# Patient Record
Sex: Male | Born: 1995
Health system: Southern US, Community
[De-identification: ages and names within clinical notes are randomized; demographics above are authoritative.]

## PROBLEM LIST (undated history)

## (undated) DIAGNOSIS — M6289 Other specified disorders of muscle: Secondary | ICD-10-CM

## (undated) DIAGNOSIS — G43909 Migraine, unspecified, not intractable, without status migrainosus: Secondary | ICD-10-CM

## (undated) DIAGNOSIS — J45909 Unspecified asthma, uncomplicated: Secondary | ICD-10-CM

## (undated) DIAGNOSIS — T7840XA Allergy, unspecified, initial encounter: Secondary | ICD-10-CM

## (undated) DIAGNOSIS — A46 Erysipelas: Secondary | ICD-10-CM

## (undated) HISTORY — DX: Allergy, unspecified, initial encounter: T78.40XA

## (undated) HISTORY — DX: Migraine, unspecified, not intractable, without status migrainosus: G43.909

## (undated) HISTORY — DX: Erysipelas: A46

## (undated) HISTORY — DX: Unspecified asthma, uncomplicated: J45.909

---
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
## 2015-01-28 ENCOUNTER — Encounter (HOSPITAL_COMMUNITY): Payer: Self-pay | Admitting: Emergency Medicine

## 2015-01-28 ENCOUNTER — Emergency Department (INDEPENDENT_AMBULATORY_CARE_PROVIDER_SITE_OTHER): Payer: 59

## 2015-01-28 ENCOUNTER — Emergency Department (HOSPITAL_COMMUNITY)
Admission: EM | Admit: 2015-01-28 | Discharge: 2015-01-28 | Disposition: A | Discharge status: Home / Self Care | Payer: 59 | Source: Home / Self Care | Attending: Family Medicine | Admitting: Family Medicine

## 2015-01-28 DIAGNOSIS — J189 Pneumonia, unspecified organism: Secondary | ICD-10-CM

## 2015-01-28 MED ORDER — IPRATROPIUM-ALBUTEROL 0.5-2.5 (3) MG/3ML IN SOLN
3.0000 mL | Freq: Once | RESPIRATORY_TRACT | Status: AC
  Administered 2015-01-28: 3 mL via RESPIRATORY_TRACT

## 2015-01-28 MED ORDER — AZITHROMYCIN 250 MG PO TABS: 250.0000 mg | ORAL_TABLET | Freq: Every day | ORAL | Status: DC

## 2015-01-28 MED ORDER — IPRATROPIUM-ALBUTEROL 0.5-2.5 (3) MG/3ML IN SOLN
RESPIRATORY_TRACT | Status: AC
  Filled 2015-01-28: qty 3

## 2015-01-28 NOTE — Discharge Instructions (Signed)
Thank you for coming in today. °Call or go to the emergency room if you get worse, have trouble breathing, have chest pains, or palpitations.  ° °Pneumonia °Pneumonia is an infection of the lungs.  °CAUSES °Pneumonia may be caused by bacteria or a virus. Usually, these infections are caused by breathing infectious particles into the lungs (respiratory tract). °SIGNS AND SYMPTOMS  °· Cough. °· Fever. °· Chest pain. °· Increased rate of breathing. °· Wheezing. °· Mucus production. °DIAGNOSIS  °If you have the common symptoms of pneumonia, your health care provider will typically confirm the diagnosis with a chest X-ray. The X-ray will show an abnormality in the lung (pulmonary infiltrate) if you have pneumonia. Other tests of your blood, urine, or sputum may be done to find the specific cause of your pneumonia. Your health care provider may also do tests (blood gases or pulse oximetry) to see how well your lungs are working. °TREATMENT  °Some forms of pneumonia may be spread to other people when you cough or sneeze. You may be asked to wear a mask before and during your exam. Pneumonia that is caused by bacteria is treated with antibiotic medicine. Pneumonia that is caused by the influenza virus may be treated with an antiviral medicine. Most other viral infections must run their course. These infections will not respond to antibiotics.  °HOME CARE INSTRUCTIONS  °· Cough suppressants may be used if you are losing too much rest. However, coughing protects you by clearing your lungs. You should avoid using cough suppressants if you can. °· Your health care provider may have prescribed medicine if he or she thinks your pneumonia is caused by bacteria or influenza. Finish your medicine even if you start to feel better. °· Your health care provider may also prescribe an expectorant. This loosens the mucus to be coughed up. °· Take medicines only as directed by your health care provider. °· Do not smoke. Smoking is a common  cause of bronchitis and can contribute to pneumonia. If you are a smoker and continue to smoke, your cough may last several weeks after your pneumonia has cleared. °· A cold steam vaporizer or humidifier in your room or home may help loosen mucus. °· Coughing is often worse at night. Sleeping in a semi-upright position in a recliner or using a couple pillows under your head will help with this. °· Get rest as you feel it is needed. Your body will usually let you know when you need to rest. °PREVENTION °A pneumococcal shot (vaccine) is available to prevent a common bacterial cause of pneumonia. This is usually suggested for: °· People over 65 years old. °· Patients on chemotherapy. °· People with chronic lung problems, such as bronchitis or emphysema. °· People with immune system problems. °If you are over 65 or have a high risk condition, you may receive the pneumococcal vaccine if you have not received it before. In some countries, a routine influenza vaccine is also recommended. This vaccine can help prevent some cases of pneumonia. You may be offered the influenza vaccine as part of your care. °If you smoke, it is time to quit. You may receive instructions on how to stop smoking. Your health care provider can provide medicines and counseling to help you quit. °SEEK MEDICAL CARE IF: °You have a fever. °SEEK IMMEDIATE MEDICAL CARE IF:  °· Your illness becomes worse. This is especially true if you are elderly or weakened from any other disease. °· You cannot control your cough with suppressants and   are losing sleep. °· You begin coughing up blood. °· You develop pain which is getting worse or is uncontrolled with medicines. °· Any of the symptoms which initially brought you in for treatment are getting worse rather than better. °· You develop shortness of breath or chest pain. °MAKE SURE YOU:  °· Understand these instructions. °· Will watch your condition. °· Will get help right away if you are not doing well or get  worse. °Document Released: 09/08/2005 Document Revised: 01/23/2014 Document Reviewed: 11/28/2010 °ExitCare® Patient Information ©2015 [employer]. This information is not intended to replace advice given to you by your health care provider. Make sure you discuss any questions you have with your health care provider. ° °

## 2015-01-28 NOTE — ED Notes (Signed)
C/o  Nonproductive cough x 1 month.  States fever in the being couple of days.  Runny nose.  Mild sob.  Denies chest pain.  No relief with otc meds.  No n/v/d.

## 2015-01-28 NOTE — ED Provider Notes (Signed)
Carlos Clark is a 19 y.o. male who presents to Urgent Care today for cough congestion for one month associated with runny nose and mild shortness of breath. He initially had a fever and chills but none since. No chest pain or palpitations. He's tried some over-the-counter medications including Tylenol. He takes Zyrtec daily. He has a history of asthma as a child. Patient is an active [employer] [place] [employer] [place].   Past Medical History  Diagnosis Date  . Allergy   . Asthma   . Migraine headache   . Erysipelas 08/12/2012    fever/ lymphangitis/wrist lesion/+ Group A strep culture   History reviewed. No pertinent past surgical history. History  Substance Use Topics  . Smoking status: Never Smoker   . Smokeless tobacco: Never Used  . Alcohol Use: No   ROS as above Medications: No current facility-administered medications for this encounter.   Current Outpatient Prescriptions  Medication Sig Dispense Refill  . azithromycin (ZITHROMAX) 250 MG tablet Take 1 tablet (250 mg total) by mouth daily. Take first 2 tablets together, then 1 every day until finished. 6 tablet 0  . cetirizine (ZYRTEC ALLERGY) 10 MG tablet Take 10 mg by mouth as needed for allergies.     Allergies  Allergen Reactions  . Peanut-Containing Drug Products Swelling  . Shellfish Allergy Swelling    Pt is allergic all seafood     Exam:  BP 116/77 mmHg  Pulse 74  Temp(Src) 97.1 F (36.2 C) (Oral)  Resp 20  SpO2 95% Gen: Well NAD HEENT: EOMI,  MMM posterior pharynx with cobblestoning. Normal tympanic membranes bilaterally. Lungs: Normal work of breathing. Wheezing bilaterally Heart: RRR no MRG Abd: NABS, Soft. Nondistended, Nontender Exts: Brisk capillary refill, warm and well perfused.   Patient was given a 2.5/0.5 mg DuoNeb nebulizer treatment, and did not feel much better  No results found for this or any previous visit (from the past 24 hour(s)). Dg Chest 2 View  01/28/2015   CLINICAL DATA:  Cough x1  month  EXAM: CHEST - 2 VIEW  COMPARISON:  06/18/2003 by report only  FINDINGS: Patchy airspace consolidation in posterior and lateral basal segments left lower lobe. Right lung clear. Heart size normal. No effusion. Visualized skeletal structures are unremarkable.  IMPRESSION: 1. Left lower lobe pneumonia.   Electronically Signed   By: Corlis Leak  Hassell M.D.   On: 01/28/2015 13:23    Assessment and Plan: 19 y.o. male with pneumonia. Likely community-acquired. Treat with azithromycin. [facility] No running until cleared by unit physician.  Discussed warning signs or symptoms. Please see discharge instructions. Patient expresses understanding.     Rodolph BongEvan S Corey, MD 01/28/15 1336

## 2016-05-16 IMAGING — DX DG CHEST 2V
3 series · 3 of 3 positions shown · non-contrast
Comparison: 06/18/2003 by report only

CLINICAL DATA: Cough x1 month

EXAM:
CHEST - 2 VIEW

[chest pa]
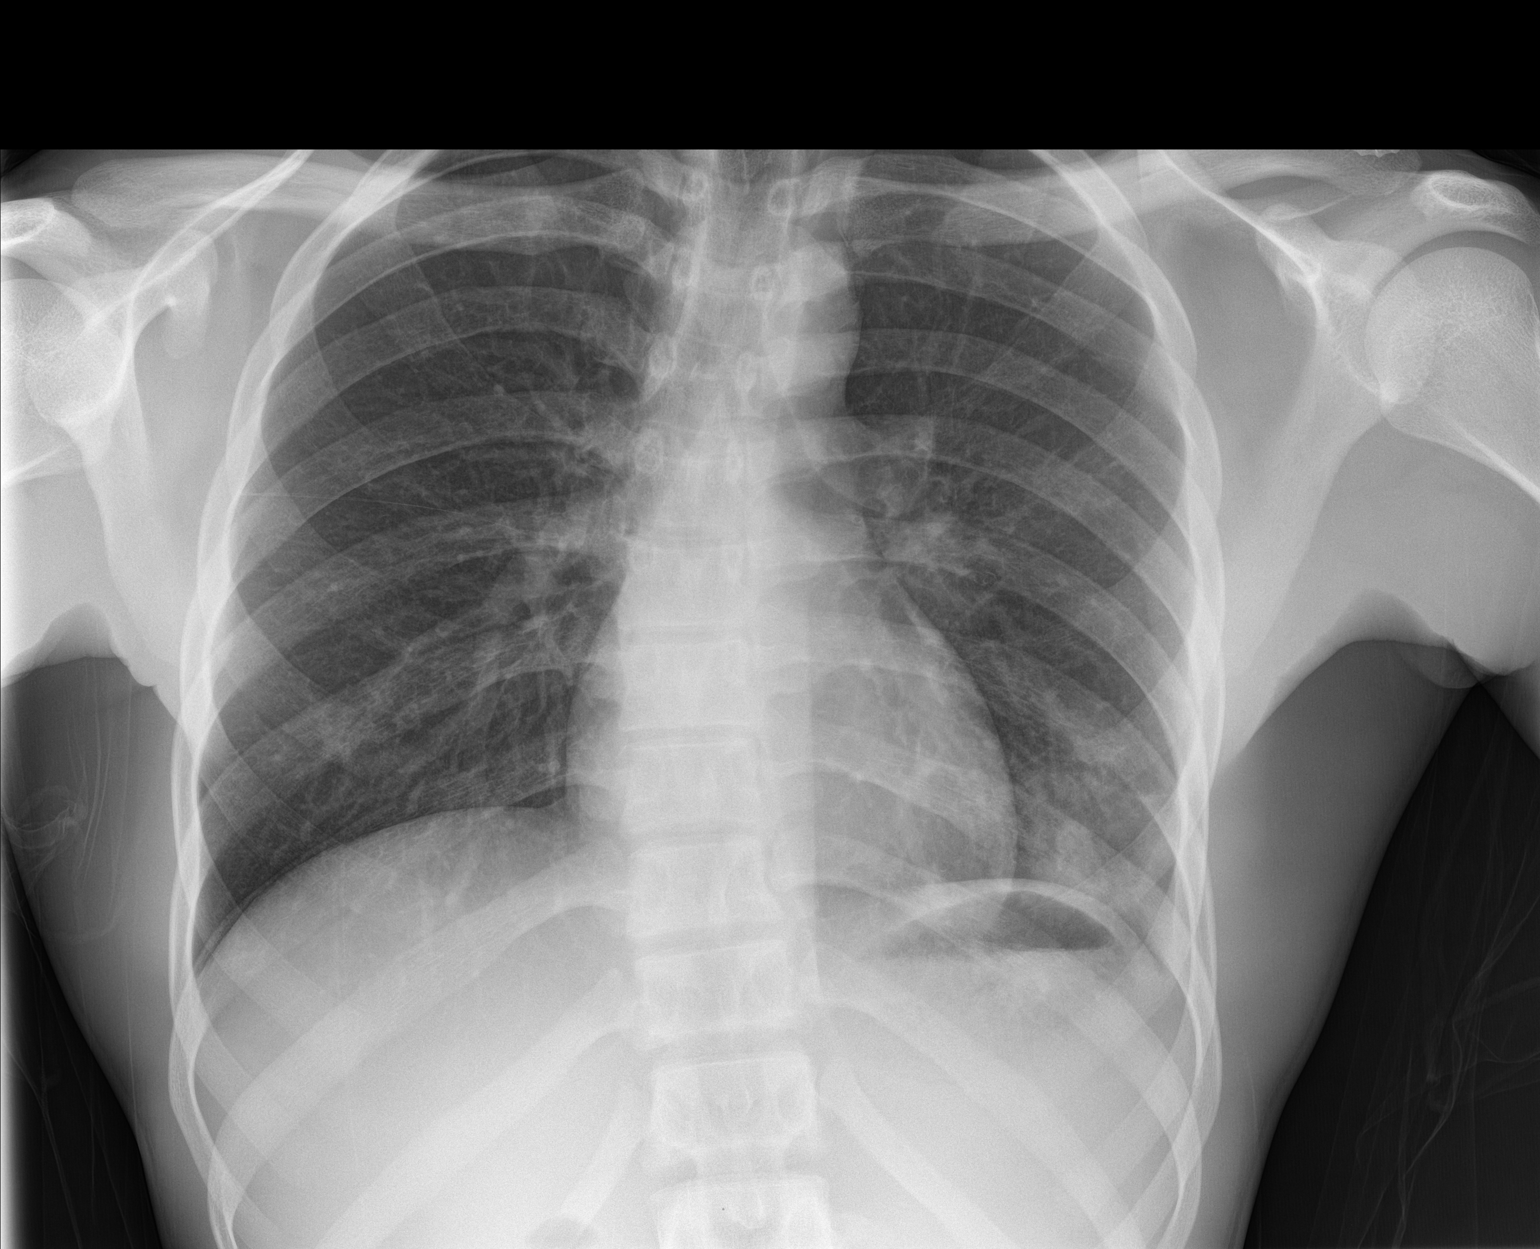

[chest lat (1 of 2)]
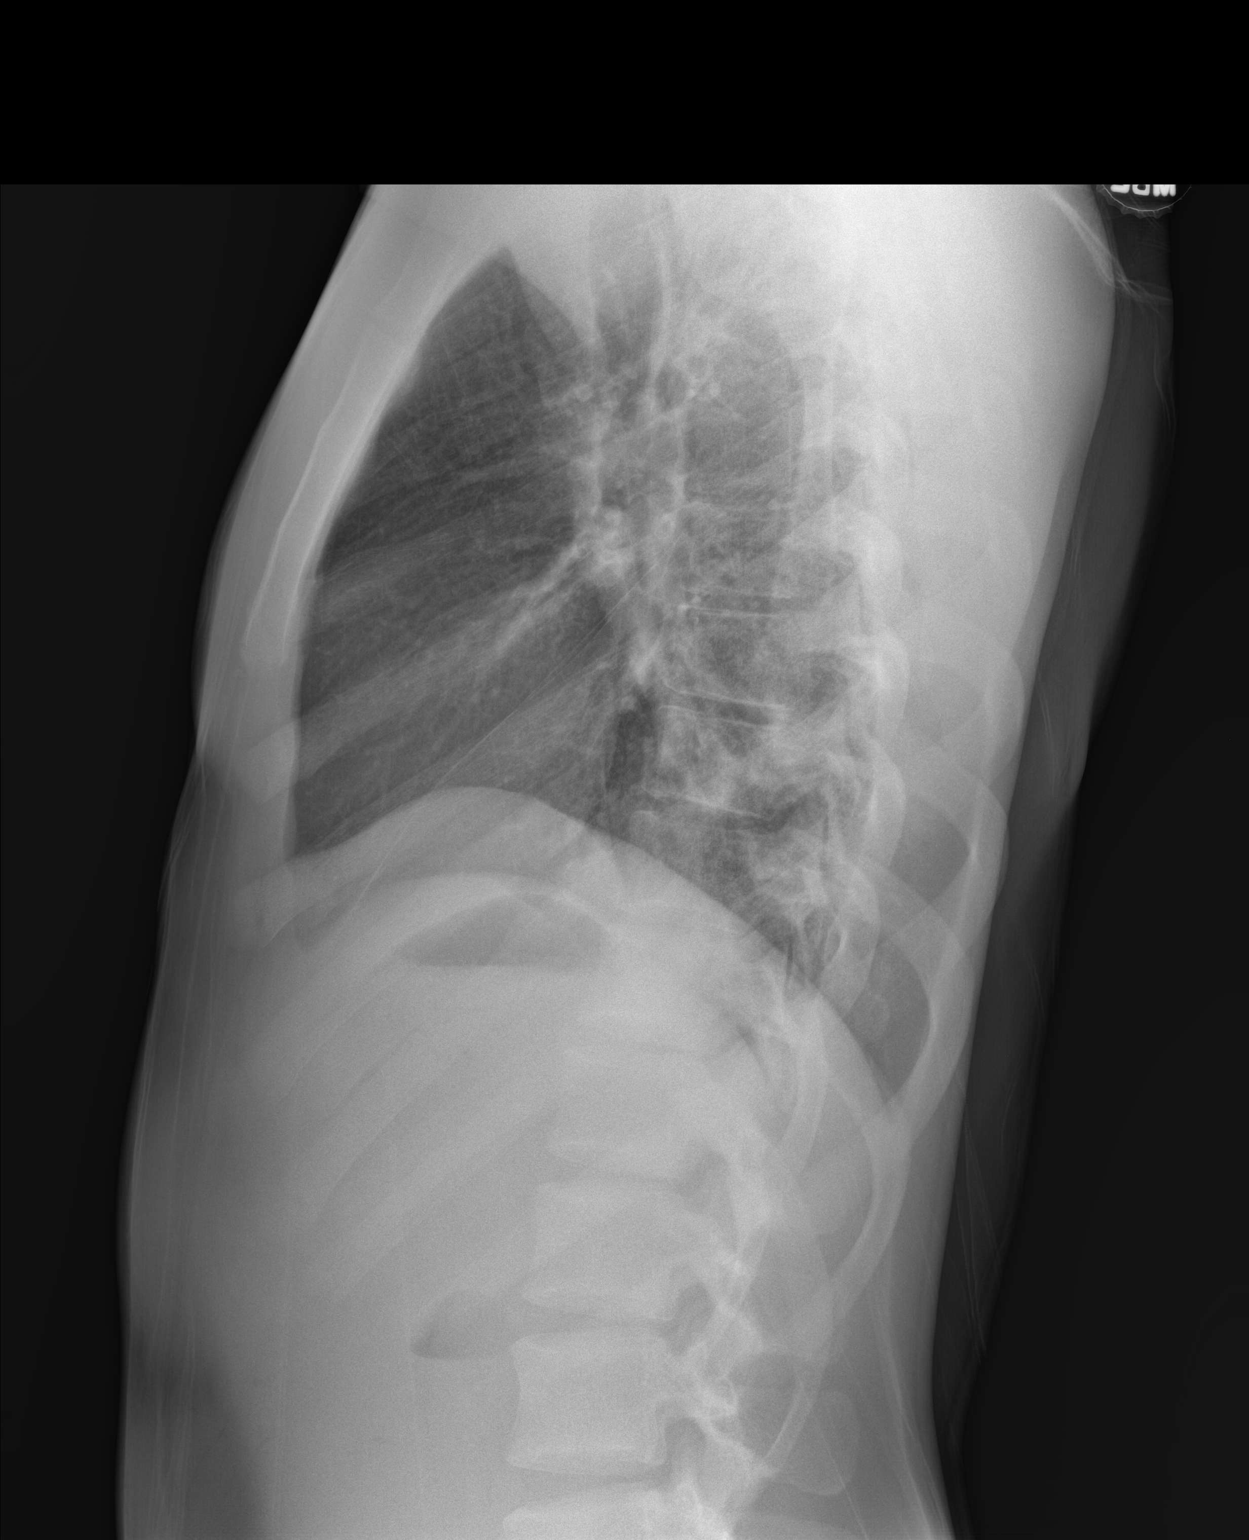

[chest lat (2 of 2)]
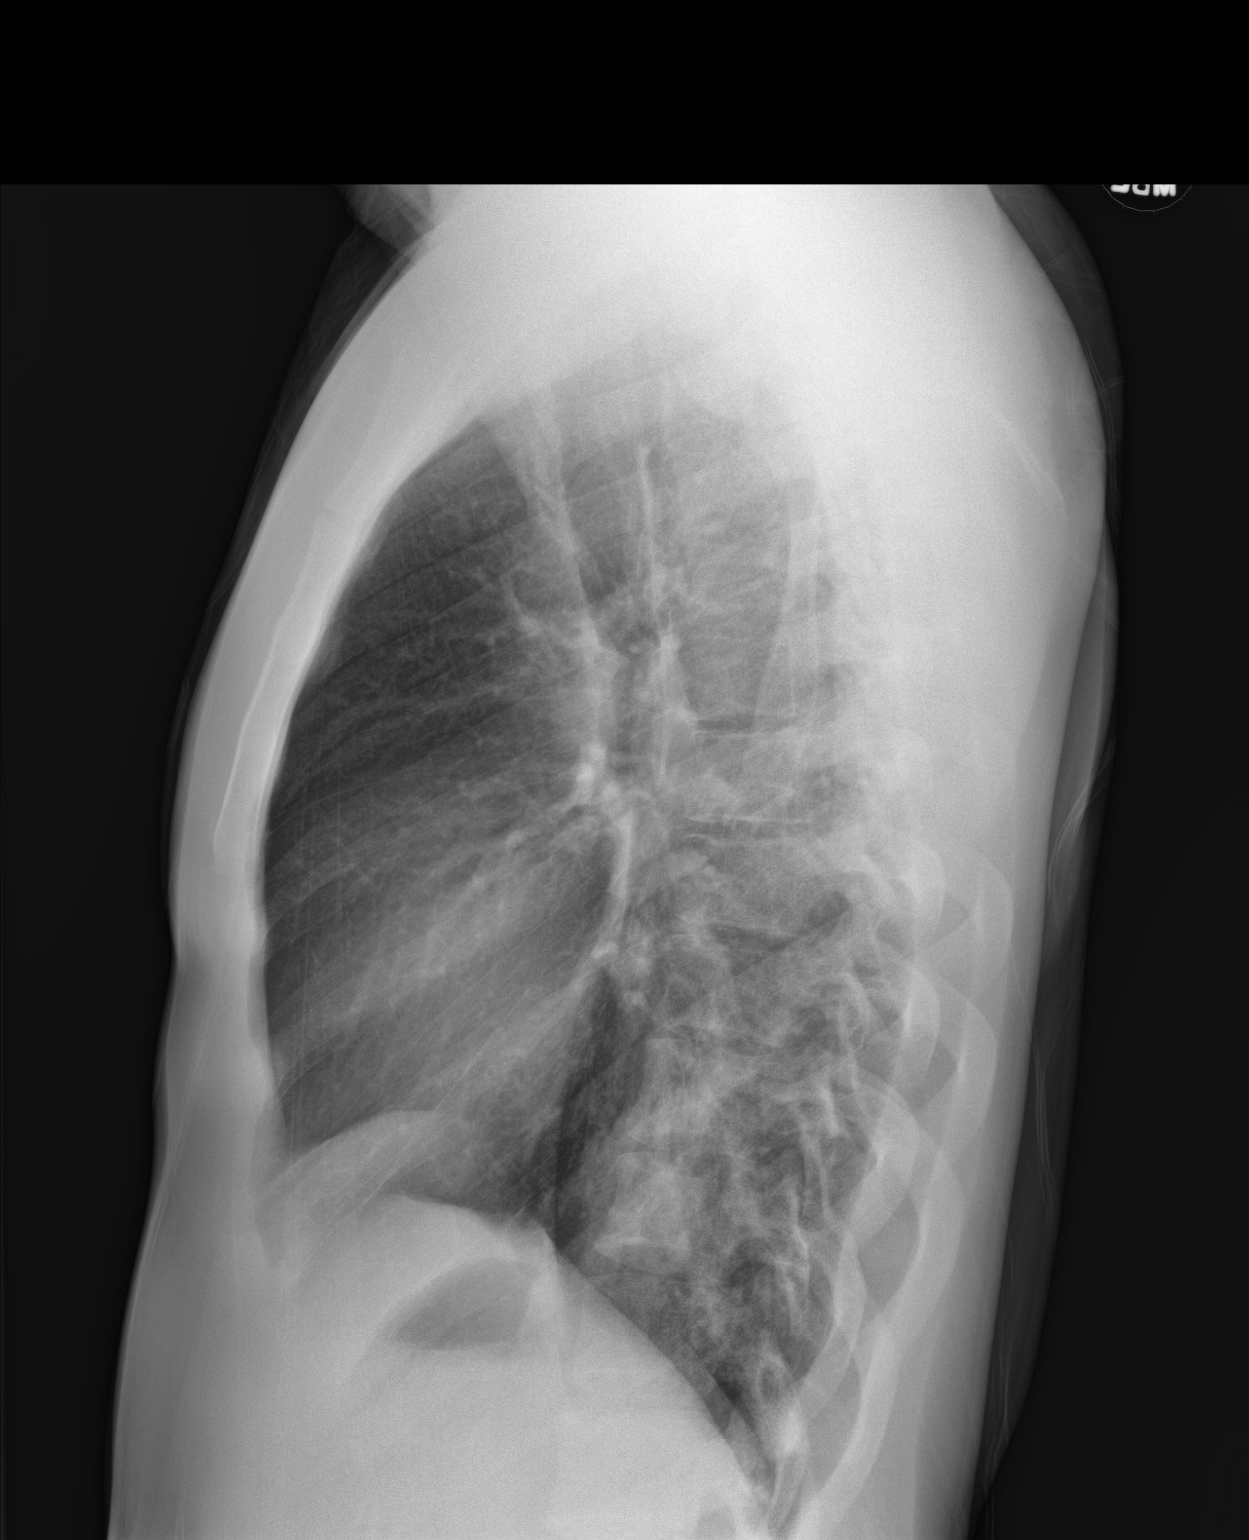

[3 of 3 positions shown; findings below may reference images not displayed]

FINDINGS: Patchy airspace consolidation in posterior and lateral basal
segments left lower lobe. Right lung clear. Heart size normal.
No effusion.
Visualized skeletal structures are unremarkable.
IMPRESSION: 1. Left lower lobe pneumonia.

## 2018-01-30 ENCOUNTER — Encounter: Payer: Self-pay | Admitting: Nurse Practitioner

## 2018-01-30 ENCOUNTER — Ambulatory Visit (INDEPENDENT_AMBULATORY_CARE_PROVIDER_SITE_OTHER): Payer: Self-pay | Admitting: Nurse Practitioner

## 2018-01-30 VITALS — BP 118/84 | HR 71 | Temp 98.4°F | Resp 18 | Wt 191.8 lb

## 2018-01-30 DIAGNOSIS — H6123 Impacted cerumen, bilateral: Secondary | ICD-10-CM

## 2018-01-30 MED ORDER — CIPROFLOXACIN-DEXAMETHASONE 0.3-0.1 % OT SUSP: 4.0000 [drp] | Freq: Two times a day (BID) | OTIC | 0 refills | Status: AC

## 2018-01-30 MED ORDER — CARBAMIDE PEROXIDE 6.5 % OT SOLN: 5.0000 [drp] | Freq: Two times a day (BID) | OTIC | 0 refills | Status: AC

## 2018-01-30 NOTE — Patient Instructions (Signed)
Earwax Buildup, Adult The ears produce a substance called earwax that helps keep bacteria out of the ear and protects the skin in the ear canal. Occasionally, earwax can build up in the ear and cause discomfort or hearing loss. What increases the risk? This condition is more likely to develop in people who:  Are male.  Are elderly.  Naturally produce more earwax.  Clean their ears often with cotton swabs.  Use earplugs often.  Use in-ear headphones often.  Wear hearing aids.  Have narrow ear canals.  Have earwax that is overly thick or sticky.  Have eczema.  Are dehydrated.  Have excess hair in the ear canal.  What are the signs or symptoms? Symptoms of this condition include:  Reduced or muffled hearing.  A feeling of fullness in the ear or feeling that the ear is plugged.  Fluid coming from the ear.  Ear pain.  Ear itch.  Ringing in the ear.  Coughing.  An obvious piece of earwax that can be seen inside the ear canal.  How is this diagnosed? This condition may be diagnosed based on:  Your symptoms.  Your medical history.  An ear exam. During the exam, your health care provider will look into your ear with an instrument called an otoscope.  You may have tests, including a hearing test. How is this treated? This condition may be treated by:  Using ear drops to soften the earwax.  Having the earwax removed by a health care provider. The health care provider may: ? Flush the ear with water. ? Use an instrument that has a loop on the end (curette). ? Use a suction device.  Surgery to remove the wax buildup. This may be done in severe cases.  Follow these instructions at home:  Take over-the-counter and prescription medicines only as told by your health care provider.  Do not put any objects, including cotton swabs, into your ear. You can clean the opening of your ear canal with a washcloth or facial tissue.  Follow instructions from your health  care provider about cleaning your ears. Do not over-clean your ears.  Drink enough fluid to keep your urine clear or pale yellow. This will help to thin the earwax.  Keep all follow-up visits as told by your health care provider. If earwax builds up in your ears often or if you use hearing aids, consider seeing your health care provider for routine, preventive ear cleanings. Ask your health care provider how often you should schedule your cleanings.  If you have hearing aids, clean them according to instructions from the manufacturer and your health care provider. Contact a health care provider if:  You have ear pain.  You develop a fever.  You have blood, pus, or other fluid coming from your ear.  You have hearing loss.  You have ringing in your ears that does not go away.  Your symptoms do not improve with treatment.  You feel like the room is spinning (vertigo). Summary  Earwax can build up in the ear and cause discomfort or hearing loss.  The most common symptoms of this condition include reduced or muffled hearing and a feeling of fullness in the ear or feeling that the ear is plugged.  This condition may be diagnosed based on your symptoms, your medical history, and an ear exam.  This condition may be treated by using ear drops to soften the earwax or by having the earwax removed by a health care provider.  Do  not put any objects, including cotton swabs, into your ear. You can clean the opening of your ear canal with a washcloth or facial tissue. This information is not intended to replace advice given to you by your health care provider. Make sure you discuss any questions you have with your health care provider. Document Released: 10/16/2004 Document Revised: 11/19/2016 Document Reviewed: 11/19/2016 Elsevier Interactive Patient Education  2018 [place] Drops, Adult You have been diagnosed with a condition that requires you to put drops of medicine into your  ears. Ear drops are a medicine that is placed in the ear. This sheet gives you information about how to use ear drops. Your health care provider may also give you more specific instructions. Supplies needed:  Cotton ball.  Medicine. How to put ear drops into your ear 1. Wash your hands thoroughly with soap and water. 2. Make sure your ears are clean and dry. If there is any ear wax or drainage at the outermost portion of the ear canal, wipe it out gently with a cotton-tipped applicator. 3. Warm up the medicine by holding it in the palm of your hand for a few minutes. 4. Shake the medicine if it is a suspension. 5. Use the dropper to draw up the medicine. 6. Hold the dropper above your ear canal and put the drops in the affected ear as instructed. Do not put the dropper into your ear at any time. It may help to pull the outer flap of the ear up and back while you put the drops in. Doing this will straighten out the ear canal so the medicine can get into the canal easier. 7. To make sure your ear soaks up the medicine, do either of these things: ? Lie down with the affected ear facing up for 10 minutes. This will cause the drops to stay in the ear canal and run down and fill the canal. ? Gently put a cotton ball in your ear canal. Leave enough of the cotton ball out so it can be easily removed. Do not push the cotton ball down into your ear with a cotton-tipped swab or other instrument. You can remove the cotton ball once the medicine has been absorbed. 8. If both ears need the drops, repeat the procedure for the other ear. Your health care provider will let you know if you need to put drops in both ears. Follow these instructions at home:  Use the ear drops for as long as directed by your health care provider, even if you begin to feel better.  Always wash your hands before and after handling the ear drops.  Keep the ear drops at room temperature.  Keep all follow-up visits as told by your  health care provider. This is important. Contact a health care provider if:  Your condition gets worse.  Your pain gets worse.  You notice any unusual drainage from your ear, especially if the drainage has a bad smell.  You have trouble hearing.  You have used the ear drops for the amount of time recommended by your health care provider, but your symptoms have not improved. Get help right away if:  You experience a form of dizziness in which you feel as if the room is spinning and you feel nauseated (vertigo).  The outside of your ear becomes red or swollen.  You develop a severe headache with or without neck stiffness. Summary  Ear drops are a medicine that is placed in the ear.  Put drops in the affected ear as instructed.  Use the ear drops for as long as directed by your health care provider, even if your symptoms begin to get better.  Keep all follow-up visits as told by your health care provider. This is important. This information is not intended to replace advice given to you by your health care provider. Make sure you discuss any questions you have with your health care provider. Document Released: 09/02/2001 Document Revised: 09/11/2016 Document Reviewed: 09/11/2016 Elsevier Interactive Patient Education  2017 [place] Irrigation What is ear irrigation? Ear irrigation is a procedure to wash dirt and wax out of your ear canal. This procedure is also called lavage. You may need ear irrigation if you are having trouble hearing because of a buildup of earwax. You may also have ear irrigation as part of the treatment for an ear infection. Getting wax and dirt out of your ear canal can help some medicines given as ear drops work better. How is ear irrigation performed? The procedure may vary among health care providers and hospitals. You may be given ear drops to put in your ear 15-20 minutes before irrigation. This helps loosen the wax. Then, a syringe containing  water and a sterile salt solution (saline) can be gently inserted into the ear canal. The saline is used to flush out wax and other debris. Ear irrigation kits are also available to use at home. Ask your health care provider if this is an option for you. Use a home irrigation kit only as told by your health care provider. Read the package instructions carefully. Follow the directions for using the syringe. Use water that is room temperature. Do not do ear irrigation at home if you:  Have diabetes. Diabetes increases the risk of infection.  Have a hole or tear in your eardrum.  Have tubes in your ears.  What are the risks of ear irrigation? Generally, this is a safe procedure. However, problems may occur, including:  Infection.  Pain.  Hearing loss.  Pushing water and debris into the eardrum. This can occur if there are holes in the eardrum.  Ear irrigation failing to work.  How should I care for my ears after having them irrigated? Cleaning  Clean the outside of your ear with a soft washcloth daily.  If told by your health care provider, use a few drops of baby oil, mineral oil, glycerin, hydrogen peroxide, or over-the-counter earwax softening drops.  Do not use cotton swabs to clean your ears. These can push wax down into the ear canal.  Do not put anything into your ears to try to remove wax. This includes ear candles. General Instructions  Take over-the-counter and prescription medicines only as told by your health care provider.  If you were prescribed an antibiotic medicine, use it as told by your health care provider. Do not stop using the antibiotic even if your condition improves.  Keep all follow-up visits as told by your health care provider. This is important.  Visit your health care provider at least once a year to have your ears and hearing checked. When should I seek medical care? Seek medical care if:  Your hearing is not improving or is getting  worse.  You have pain or redness in your ear.  You have fluid, blood, or pus coming out of your ear.  This information is not intended to replace advice given to you by your health care provider. Make sure you discuss any  questions you have with your health care provider. Document Released: 10/05/2015 Document Revised: 08/04/2016 Document Reviewed: 02/15/2015 Elsevier Interactive Patient Education  2017 [employer].

## 2018-01-30 NOTE — Progress Notes (Addendum)
Subjective:    Patient ID: Carlos Clark, male    DOB: 30-Apr-1996, 22 y.o.   MRN: [id number]  The patient is a 22 year old male who presents today for complaints of left ear pain.  The patient states the pain started about 4 days ago.  Patient denies any drainage with the left ear.  Patient denies any upper respiratory symptoms such as cough, congestion, fever, or runny nose.  Patient states he also has some pain that runs along the posterior side of the ear along his left mandible.  Patient states he has a history of his ears being clogged.  Patient also complains of diminished hearing in the left ear.  Patient normally uses peroxide for his ears which he has not done in quite some time.  Patient has not taken any medications or use peroxide for his complaints of today.  Reviewed patient's past medical history, current medications and allergies.   Review of Systems  Constitutional: Negative.   HENT: Positive for ear pain (left) and hearing loss (diminished hearing in the left ear). Negative for congestion, sinus pressure, sinus pain and sneezing.   Eyes: Negative.   Respiratory: Negative.   Cardiovascular: Negative.        Objective:   Physical Exam  Constitutional: He appears well-developed and well-nourished. No distress.  HENT:  Head: Normocephalic and atraumatic.  copius cerumen in bilateral ear.  Unable to visualize TM on both.    Eyes: Pupils are equal, round, and reactive to light. EOM are normal.  Neck: Normal range of motion. Neck supple.  Cardiovascular: Normal rate, regular rhythm and normal heart sounds.  Pulmonary/Chest: Effort normal and breath sounds normal.  Skin: Skin is warm and dry.  Psychiatric: He has a normal mood and affect.   Cerumen impaction attempted for both ears.  Good return on the left ear, able to visualize tympanic membrane after irrigation.  Tympanic membrane was opaque with visualization of the landmarks, no erythema or bulging. After irrigating  the left ear, minimal return on the disimpaction.  Had to use a curette to attempt to manually remove the cerumen.  Patient tolerated but was very uncomfortable during this procedure.  Placed approximately 0.5 mils of peroxide in the left ear canal and let it sit.  Irrigation was repeated with again minimal return.  Curette was then used again to manually disimpact the left ear.  Moderate return of cerumen which was hard and foul-smelling.  Tympanic membrane was erythematous with some bulging.        Assessment & Plan:  Bilateral cerumen impaction 1.  Ciprodex otic drops 4 drops twice daily to the left ear. 2.  Debrox otic solution 5 drops to both ears twice daily.  Instructed patient to not place drops with the Ciprodex. 3.  Lengthy discussion with patient regarding use of the Debrox and the purpose of keeping earwax soft versus being hard and how it affects hearing and equilibrium. 4.  Informed patient that he could also use peroxide to his ears when they need cleaning at home.  Patient also instructed that he could let warm water run in his ears from time to time while in the shower to help keep the earwax soft. 5.  Instructed to use ibuprofen or Tylenol for ear pain or other discomfort. 6.  Patient education provided. 7.  Patient verbalizes understanding and has no questions at time of discharge. Meds ordered this encounter  Medications  . carbamide peroxide (DEBROX) 6.5 % OTIC solution  Sig: Place 5 drops into both ears 2 (two) times daily for 10 days.    Dispense:  15 mL    Refill:  0    Order Specific Question:   Supervising Provider    Answer:   Stacie Glaze [[id number]]  . ciprofloxacin-dexamethasone (CIPRODEX) OTIC suspension    Sig: Place 4 drops into the left ear 2 (two) times daily for 10 days.    Dispense:  7.5 mL    Refill:  0    Order Specific Question:   Supervising Provider    Answer:   Stacie Glaze [phone]

## 2018-11-13 ENCOUNTER — Other Ambulatory Visit: Payer: Self-pay

## 2018-11-13 ENCOUNTER — Encounter: Payer: Self-pay | Admitting: Emergency Medicine

## 2018-11-13 ENCOUNTER — Ambulatory Visit
Admission: EM | Admit: 2018-11-13 | Discharge: 2018-11-13 | Disposition: A | Discharge status: Home / Self Care | Payer: BLUE CROSS/BLUE SHIELD | Attending: Family Medicine | Admitting: Family Medicine

## 2018-11-13 DIAGNOSIS — J4521 Mild intermittent asthma with (acute) exacerbation: Secondary | ICD-10-CM | POA: Diagnosis not present

## 2018-11-13 LAB — POCT INFLUENZA A/B
INFLUENZA A, POC: NEGATIVE
Influenza B, POC: NEGATIVE

## 2018-11-13 MED ORDER — PREDNISONE 20 MG PO TABS: 40.0000 mg | ORAL_TABLET | Freq: Every day | ORAL | 0 refills | Status: DC

## 2018-11-13 MED ORDER — ALBUTEROL SULFATE HFA 108 (90 BASE) MCG/ACT IN AERS: 2.0000 | INHALATION_SPRAY | RESPIRATORY_TRACT | 1 refills | Status: DC | PRN

## 2018-11-13 MED ORDER — BENZONATATE 100 MG PO CAPS: 100.0000 mg | ORAL_CAPSULE | Freq: Three times a day (TID) | ORAL | 0 refills | Status: DC | PRN

## 2018-11-13 MED ORDER — CETIRIZINE HCL 10 MG PO TABS: 10.0000 mg | ORAL_TABLET | ORAL | 0 refills | Status: DC | PRN

## 2018-11-13 MED ORDER — PREDNISONE 20 MG PO TABS: 40.0000 mg | ORAL_TABLET | Freq: Every day | ORAL | 0 refills | Status: AC

## 2018-11-13 NOTE — Discharge Instructions (Addendum)
Take all medications as prescribe.  Use inhaler as prescribed for shortness of breath and or wheezing every 4-6 hours.

## 2018-11-13 NOTE — ED Triage Notes (Signed)
Per pt her has been having cough, congestion, fatigue and loss of appetite for about 3 day. Pt says he is having some wheezing and discomfort when he is coughing in his chest. No fevers or chills.

## 2018-11-13 NOTE — ED Provider Notes (Signed)
[facility]    CSN: [id number] Arrival date & time: 11/13/18  1257     History   Chief Complaint Chief Complaint  Patient presents with  . Cough    HPI Carlos Dushawn Sark. is a 23 y.o. male.  HPI  History of asthma. Not currently prescribed chronic asthma management therapy, presents with concern for influenza. He has experienced a gradually worsening cough, fatigue, poor appetite, and wheezing for approximately one week. Symptoms have mildly improved, however at present,he complains of dry, hacking cough. He was prescribed years ago an albuterol inhaler but reports he rarely uses. Unknown if he has experienced fever.  Denies any other symptoms. Past Medical History:  Diagnosis Date  . Allergy   . Asthma   . Erysipelas 08/12/2012   fever/ lymphangitis/wrist lesion/+ Group A strep culture  . Migraine headache     Patient Active Problem List   Diagnosis Date Noted  . Well child check 08/11/2012  . Extrinsic asthma, history of 04/26/2012  . Seasonal allergies 04/26/2012  . Food allergy 04/26/2012    No past surgical history on file.    Home Medications    Prior to Admission medications   Medication Sig Start Date End Date Taking? Authorizing Provider  albuterol (PROVENTIL HFA;VENTOLIN HFA) 108 (90 Base) MCG/ACT inhaler Inhale 2 puffs into the lungs every 4 (four) hours as needed for wheezing or shortness of breath (cough, shortness of breath or wheezing.). 11/13/18   Bing Neighbors, FNP  benzonatate (TESSALON) 100 MG capsule Take 1-2 capsules (100-200 mg total) by mouth 3 (three) times daily as needed for cough. 11/13/18   Bing Neighbors, FNP  cetirizine (ZYRTEC ALLERGY) 10 MG tablet Take 1 tablet (10 mg total) by mouth as needed for allergies. 11/13/18   Bing Neighbors, FNP  predniSONE (DELTASONE) 20 MG tablet Take 2 tablets (40 mg total) by mouth daily with breakfast for 5 days. 11/13/18 11/18/18  Bing Neighbors, FNP    Family History Family  History  Problem Relation Age of Onset  . Hypertension Mother   . Migraines Mother   . Hypertension Maternal Uncle   . Asthma Paternal Uncle   . Diabetes Maternal Grandmother   . Hypertension Maternal Grandfather   . Diabetes Maternal Grandfather   . Hypertension Paternal Grandmother   . Asthma Paternal Grandfather     Social History Social History   Tobacco Use  . Smoking status: Never Smoker  . Smokeless tobacco: Never Used  Substance Use Topics  . Alcohol use: No  . Drug use: No     Allergies   Peanut-containing drug products and Shellfish allergy   Review of Systems Review of Systems Pertinent negatives listed in HPI  Physical Exam Triage Vital Signs ED Triage Vitals  Enc Vitals Group     BP 11/13/18 1306 (!) 145/81     Pulse Rate 11/13/18 1306 72     Resp 11/13/18 1306 16     Temp 11/13/18 1306 (!) 97.4 F (36.3 C)     Temp Source 11/13/18 1306 Oral     SpO2 11/13/18 1306 96 %     Weight 11/13/18 1307 200 lb (90.7 kg)     Height 11/13/18 1307 6\' 1"  (1.854 m)     Head Circumference --      Peak Flow --      Pain Score 11/13/18 1307 0     Pain Loc --      Pain Edu? --  Excl. in GC? --    No data found.  Updated Vital Signs BP (!) 145/81 (BP Location: Right Arm)   Pulse 72   Temp (!) 97.4 F (36.3 C) (Oral)   Resp 16   Ht 6\' 1"  (1.854 m)   Wt 200 lb (90.7 kg)   SpO2 96%   BMI 26.39 kg/m   Visual Acuity Right Eye Distance:   Left Eye Distance:   Bilateral Distance:    Right Eye Near:   Left Eye Near:    Bilateral Near:     Physical Exam Constitutional: Patient appears non-ill appearing and well-nourished. No distress. HENT: Normocephalic, atraumatic, External right and left ear normal. Oropharynx is clear and moist.  Eyes: Conjunctivae and EOM are normal. PERRLA, no scleral icterus. Neck: Normal ROM. Neck supple. No JVD. No tracheal deviation. No thyromegaly. CVS: RRR, S1/S2 +, no murmurs, no gallops, no carotid bruit.  Pulmonary:  Effort slightly increased. Wheezing present. Rhoncohrous cough present. Musculoskeletal: Normal range of motion. No edema and no tenderness. Skin: Skin is warm and dry. No rash noted. Not diaphoretic. No erythema. No pallor. Psychiatric: Normal mood and affect. Behavior, judgment, thought content normal.  UC Treatments / Results  Labs (all labs ordered are listed, but only abnormal results are displayed) Labs Reviewed  POCT INFLUENZA A/B    EKG None  Radiology No results found.  Procedures Procedures (including critical care time)  Medications Ordered in UC Medications - No data to display  Initial Impression / Assessment and Plan / UC Course  I have reviewed the triage vital signs and the nursing notes.  Pertinent labs & imaging results that were available during my care of the patient were reviewed by me and considered in my medical decision making (see chart for details).     Patient presents today with concern for influenza. Flu negative. Exam findings and symptoms most consistent with an acute asthma exacerbation. Patient is stable for discharge. Prescribed a short course of prednisone, benzonatate, cetrizine, and albuterol inhaler. Recommended follow-up with PCP or return if symptoms worsen of do no improve.  Final Clinical Impressions(s) / UC Diagnoses   Final diagnoses:  Mild intermittent asthma with acute exacerbation     Discharge Instructions     Take all medications as prescribe.  Use inhaler as prescribed for shortness of breath and or wheezing every 4-6 hours.    ED Prescriptions    Medication Sig Dispense Auth. Provider   albuterol (PROVENTIL HFA;VENTOLIN HFA) 108 (90 Base) MCG/ACT inhaler Inhale 2 puffs into the lungs every 4 (four) hours as needed for wheezing or shortness of breath (cough, shortness of breath or wheezing.). 1 Inhaler Bing Neighbors, FNP   predniSONE (DELTASONE) 20 MG tablet Take 2 tablets (40 mg total) by mouth daily with  breakfast for 5 days. 10 tablet Bing Neighbors, FNP   benzonatate (TESSALON) 100 MG capsule Take 1-2 capsules (100-200 mg total) by mouth 3 (three) times daily as needed for cough. 40 capsule Bing Neighbors, FNP   cetirizine (ZYRTEC ALLERGY) 10 MG tablet Take 1 tablet (10 mg total) by mouth as needed for allergies. 90 tablet Bing Neighbors, FNP     Controlled Substance Prescriptions [place] Controlled Substance Registry consulted? Not Applicable   Bing Neighbors, FNP 11/17/18 2132

## 2018-11-16 ENCOUNTER — Ambulatory Visit (INDEPENDENT_AMBULATORY_CARE_PROVIDER_SITE_OTHER): Payer: BLUE CROSS/BLUE SHIELD

## 2018-11-16 ENCOUNTER — Encounter (HOSPITAL_COMMUNITY): Payer: Self-pay | Admitting: Emergency Medicine

## 2018-11-16 ENCOUNTER — Ambulatory Visit (HOSPITAL_COMMUNITY)
Admission: EM | Admit: 2018-11-16 | Discharge: 2018-11-16 | Disposition: A | Discharge status: Home / Self Care | Payer: BLUE CROSS/BLUE SHIELD | Attending: Family Medicine | Admitting: Family Medicine

## 2018-11-16 DIAGNOSIS — R0789 Other chest pain: Secondary | ICD-10-CM | POA: Diagnosis not present

## 2018-11-16 DIAGNOSIS — R69 Illness, unspecified: Secondary | ICD-10-CM

## 2018-11-16 DIAGNOSIS — R05 Cough: Secondary | ICD-10-CM | POA: Diagnosis not present

## 2018-11-16 DIAGNOSIS — J111 Influenza due to unidentified influenza virus with other respiratory manifestations: Secondary | ICD-10-CM

## 2018-11-16 DIAGNOSIS — R062 Wheezing: Secondary | ICD-10-CM

## 2018-11-16 DIAGNOSIS — J40 Bronchitis, not specified as acute or chronic: Secondary | ICD-10-CM

## 2018-11-16 MED ORDER — ALBUTEROL SULFATE (2.5 MG/3ML) 0.083% IN NEBU
INHALATION_SOLUTION | RESPIRATORY_TRACT | Status: AC
  Filled 2018-11-16: qty 3

## 2018-11-16 MED ORDER — ALBUTEROL SULFATE (2.5 MG/3ML) 0.083% IN NEBU: 2.5000 mg | INHALATION_SOLUTION | Freq: Four times a day (QID) | RESPIRATORY_TRACT | 12 refills | Status: DC | PRN

## 2018-11-16 MED ORDER — DM-GUAIFENESIN ER 30-600 MG PO TB12: 1.0000 | ORAL_TABLET | Freq: Two times a day (BID) | ORAL | 0 refills | Status: DC

## 2018-11-16 NOTE — ED Provider Notes (Addendum)
[facility]    CSN: [id number] Arrival date & time: 11/16/18  1425     History   Chief Complaint Chief Complaint  Patient presents with  . Cough    HPI Carlos Clark. is a 23 y.o. male.   Patient is a 23 year old male with past medical history of asthma, allergies.  He presents with cough, congestion, wheezing.  This is been constant and remained the same since previously being seen on the 22nd.  He was treated with prednisone, Tessalon Perles and has been using albuterol inhaler.  He feels like his symptoms have not worsened but they have not improved at all.  He still having a lot of wheezing.  Denies any fevers, chest pain, shortness of breath.  ROS per HPI        Past Medical History:  Diagnosis Date  . Allergy   . Asthma   . Erysipelas 08/12/2012   fever/ lymphangitis/wrist lesion/+ Group A strep culture  . Migraine headache     Patient Active Problem List   Diagnosis Date Noted  . Well child check 08/11/2012  . Extrinsic asthma, history of 04/26/2012  . Seasonal allergies 04/26/2012  . Food allergy 04/26/2012    History reviewed. No pertinent surgical history.     Home Medications    Prior to Admission medications   Medication Sig Start Date End Date Taking? Authorizing Provider  albuterol (PROVENTIL HFA;VENTOLIN HFA) 108 (90 Base) MCG/ACT inhaler Inhale 2 puffs into the lungs every 4 (four) hours as needed for wheezing or shortness of breath (cough, shortness of breath or wheezing.). 11/13/18   Bing Neighbors, FNP  albuterol (PROVENTIL) (2.5 MG/3ML) 0.083% nebulizer solution Take 3 mLs (2.5 mg total) by nebulization every 6 (six) hours as needed for wheezing or shortness of breath. 11/16/18   Saniah Schroeter A, NP  benzonatate (TESSALON) 100 MG capsule Take 1-2 capsules (100-200 mg total) by mouth 3 (three) times daily as needed for cough. 11/13/18   Bing Neighbors, FNP  cetirizine (ZYRTEC ALLERGY) 10 MG tablet Take 1 tablet (10 mg  total) by mouth as needed for allergies. 11/13/18   Bing Neighbors, FNP  dextromethorphan-guaiFENesin [facility] DM) 30-600 MG 12hr tablet Take 1 tablet by mouth 2 (two) times daily. 11/16/18   Dahlia Byes A, NP  predniSONE (DELTASONE) 20 MG tablet Take 2 tablets (40 mg total) by mouth daily with breakfast for 5 days. 11/13/18 11/18/18  Bing Neighbors, FNP    Family History Family History  Problem Relation Age of Onset  . Hypertension Mother   . Migraines Mother   . Hypertension Maternal Uncle   . Asthma Paternal Uncle   . Diabetes Maternal Grandmother   . Hypertension Maternal Grandfather   . Diabetes Maternal Grandfather   . Hypertension Paternal Grandmother   . Asthma Paternal Grandfather     Social History Social History   Tobacco Use  . Smoking status: Never Smoker  . Smokeless tobacco: Never Used  Substance Use Topics  . Alcohol use: No  . Drug use: No     Allergies   Peanut-containing drug products and Shellfish allergy   Review of Systems Review of Systems  Constitutional: Positive for activity change. Negative for appetite change, chills, diaphoresis, fatigue, fever and unexpected weight change.  HENT: Positive for congestion.   Respiratory: Positive for cough, shortness of breath and wheezing. Negative for apnea, choking, chest tightness and stridor.   Cardiovascular: Negative for chest pain, palpitations and leg swelling.  Physical Exam Triage Vital Signs ED Triage Vitals  Enc Vitals Group     BP 11/16/18 1446 111/77     Pulse Rate 11/16/18 1446 75     Resp 11/16/18 1446 16     Temp 11/16/18 1446 98.2 F (36.8 C)     Temp Source 11/16/18 1446 Oral     SpO2 11/16/18 1446 100 %     Weight --      Height --      Head Circumference --      Peak Flow --      Pain Score 11/16/18 1448 0     Pain Loc --      Pain Edu? --      Excl. in GC? --    No data found.  Updated Vital Signs BP 111/77 (BP Location: Left Arm)   Pulse 75   Temp 98.2 F  (36.8 C) (Oral)   Resp 16   SpO2 100%   Visual Acuity Right Eye Distance:   Left Eye Distance:   Bilateral Distance:    Right Eye Near:   Left Eye Near:    Bilateral Near:     Physical Exam Vitals signs and nursing note reviewed.  Constitutional:      General: He is not in acute distress.    Appearance: Normal appearance. He is well-developed. He is not ill-appearing, toxic-appearing or diaphoretic.  HENT:     Head: Normocephalic and atraumatic.     Right Ear: Tympanic membrane and ear canal normal.     Left Ear: Tympanic membrane and ear canal normal.     Nose: Nose normal.     Mouth/Throat:     Pharynx: Oropharynx is clear.  Eyes:     Conjunctiva/sclera: Conjunctivae normal.  Neck:     Musculoskeletal: Normal range of motion and neck supple.  Cardiovascular:     Rate and Rhythm: Normal rate and regular rhythm.     Heart sounds: No murmur.  Pulmonary:     Effort: Pulmonary effort is normal. No respiratory distress.     Breath sounds: Wheezing present.  Abdominal:     Palpations: Abdomen is soft.     Tenderness: There is no abdominal tenderness.  Musculoskeletal: Normal range of motion.  Skin:    General: Skin is warm and dry.  Neurological:     Mental Status: He is alert.  Psychiatric:        Mood and Affect: Mood normal.      UC Treatments / Results  Labs (all labs ordered are listed, but only abnormal results are displayed) Labs Reviewed - No data to display  EKG None  Radiology Dg Chest 2 View  Result Date: 11/16/2018 CLINICAL DATA:  Cough and chest tightness. Wheezing. EXAM: CHEST - 2 VIEW COMPARISON:  01/28/2015. FINDINGS: Normal sized heart. Clear lungs. Mild peribronchial thickening. Stable mild to moderate dextroconvex scoliosis. IMPRESSION: Mild bronchitic changes. Electronically Signed   By: Beckie Salts M.D.   On: 11/16/2018 15:43    Procedures Procedures (including critical care time)  Medications Ordered in UC Medications - No data to  display  Initial Impression / Assessment and Plan / UC Course  I have reviewed the triage vital signs and the nursing notes.  Pertinent labs & imaging results that were available during my care of the patient were reviewed by me and considered in my medical decision making (see chart for details).     Pt is a 23 year old male with PMH  of asthma. He has been taking prednisone, albuterol inhaler, Tessalon Perles without much improvement in symptoms for last 3 days. His symptoms have not worsened. X-ray revealed bronchitis without pneumonia. We will have him continue the prednisone and Tessalon Perles Sending patient home with a nebulizer machine to use every 6 hours as needed for cough, wheezing, shortness of breath he can try Mucinex DM to see if this hospital for cough and congestion Continue the Zyrtec daily A few days given off work to rest to see if this improves symptoms Follow up as needed for continued or worsening symptoms  Final Clinical Impressions(s) / UC Diagnoses   Final diagnoses:  Bronchitis  Influenza-like illness     Discharge Instructions     X-ray revealed bronchitis No signs of infection We will send you home with a nebulizer machine to do treatments every 6 hours as needed for cough, wheezing, shortness of breath You can maybe try Mucinex DM to see if this helps better with the cough and congestion Continue the Zyrtec daily and finish out the prednisone that was prescribed I will give you a work note for the next couple of days to see if this helps Follow up as needed for continued or worsening symptoms     ED Prescriptions    Medication Sig Dispense Auth. Provider   dextromethorphan-guaiFENesin (MUCINEX DM) 30-600 MG 12hr tablet Take 1 tablet by mouth 2 (two) times daily. 14 tablet Gay Rape A, NP   albuterol (PROVENTIL) (2.5 MG/3ML) 0.083% nebulizer solution Take 3 mLs (2.5 mg total) by nebulization every 6 (six) hours as needed for wheezing or  shortness of breath. 75 mL Dahlia Byes A, NP     Controlled Substance Prescriptions [place] Controlled Substance Registry consulted? Not Applicable   Janace Aris, NP 11/17/18 1309    Dahlia Byes A, NP 11/17/18 1626

## 2018-11-16 NOTE — ED Triage Notes (Signed)
Pt c/o cough, was seen at [facility] on the 22nd, states hes not feeling better. No pain.

## 2018-11-16 NOTE — Discharge Instructions (Signed)
X-ray revealed bronchitis No signs of infection We will send you home with a nebulizer machine to do treatments every 6 hours as needed for cough, wheezing, shortness of breath You can maybe try Mucinex DM to see if this helps better with the cough and congestion Continue the Zyrtec daily and finish out the prednisone that was prescribed I will give you a work note for the next couple of days to see if this helps Follow up as needed for continued or worsening symptoms

## 2020-03-04 IMAGING — DX DG CHEST 2V
2 series · 2 of 2 positions shown · non-contrast
Comparison: 01/28/2015.

CLINICAL DATA: Cough and chest tightness. Wheezing.

EXAM:
CHEST - 2 VIEW

[chest pa]
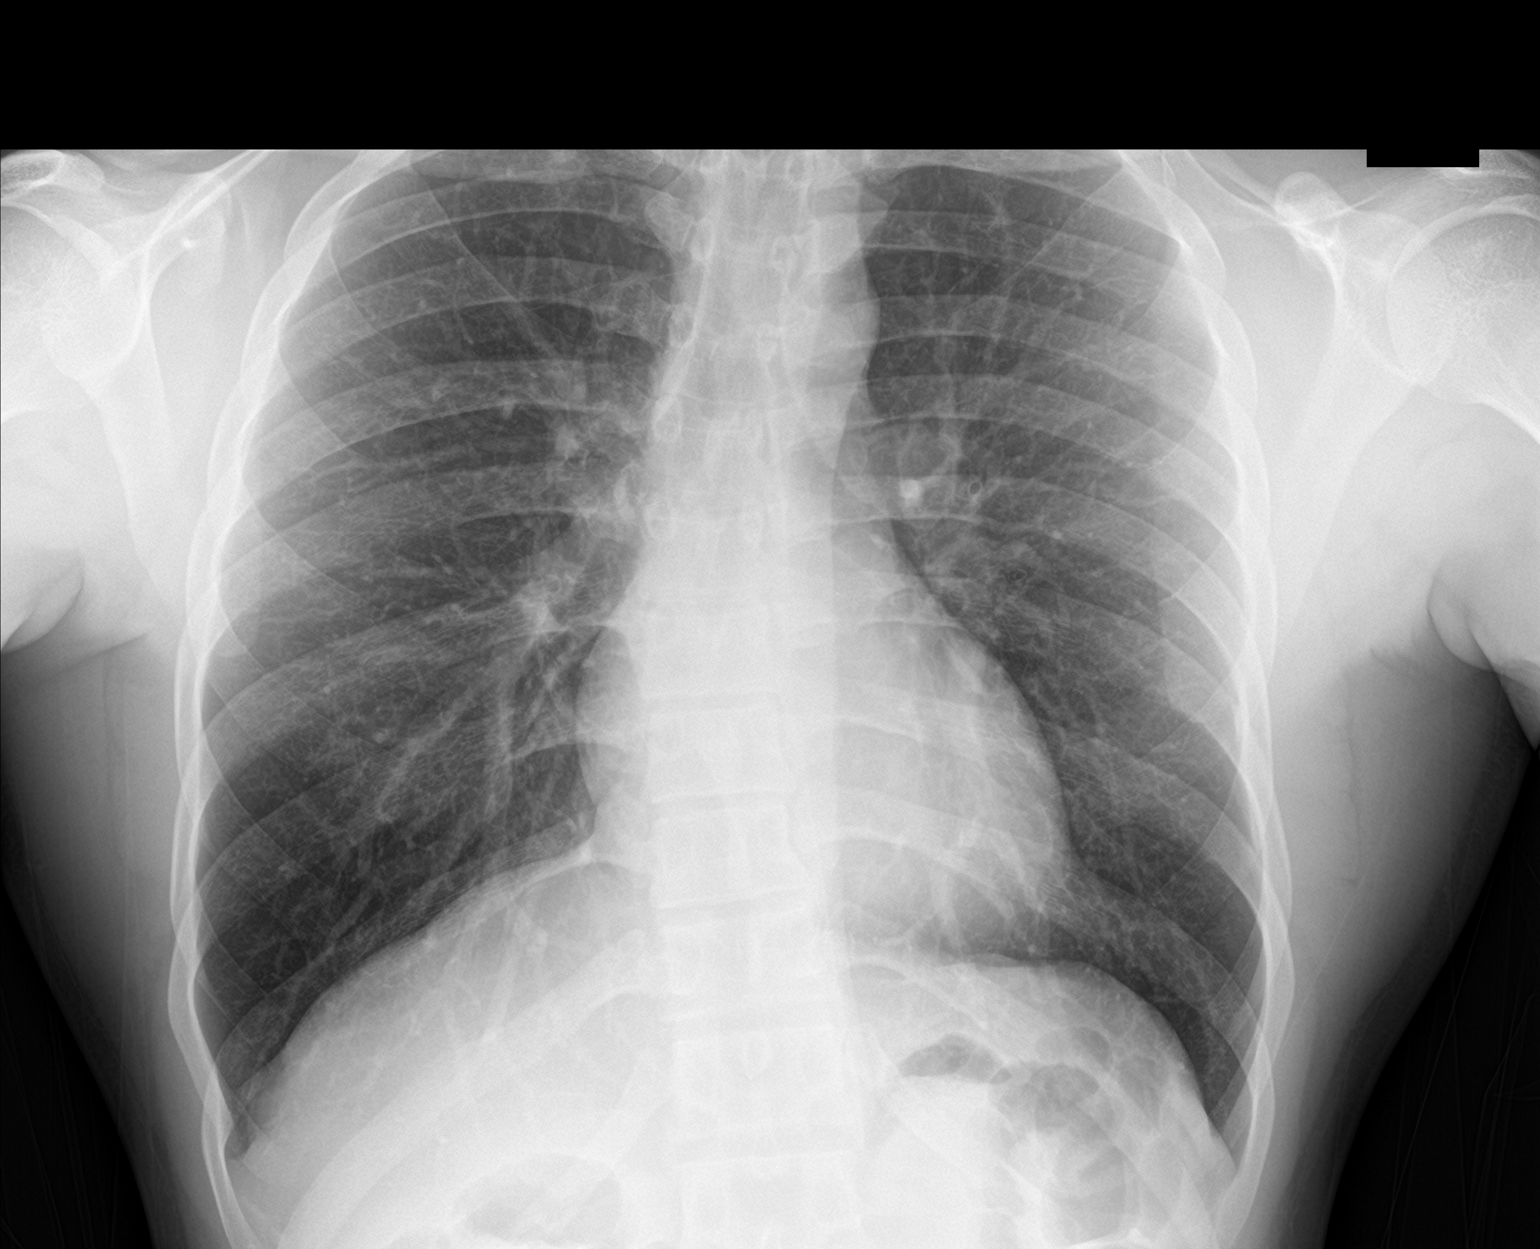

[chest lat]
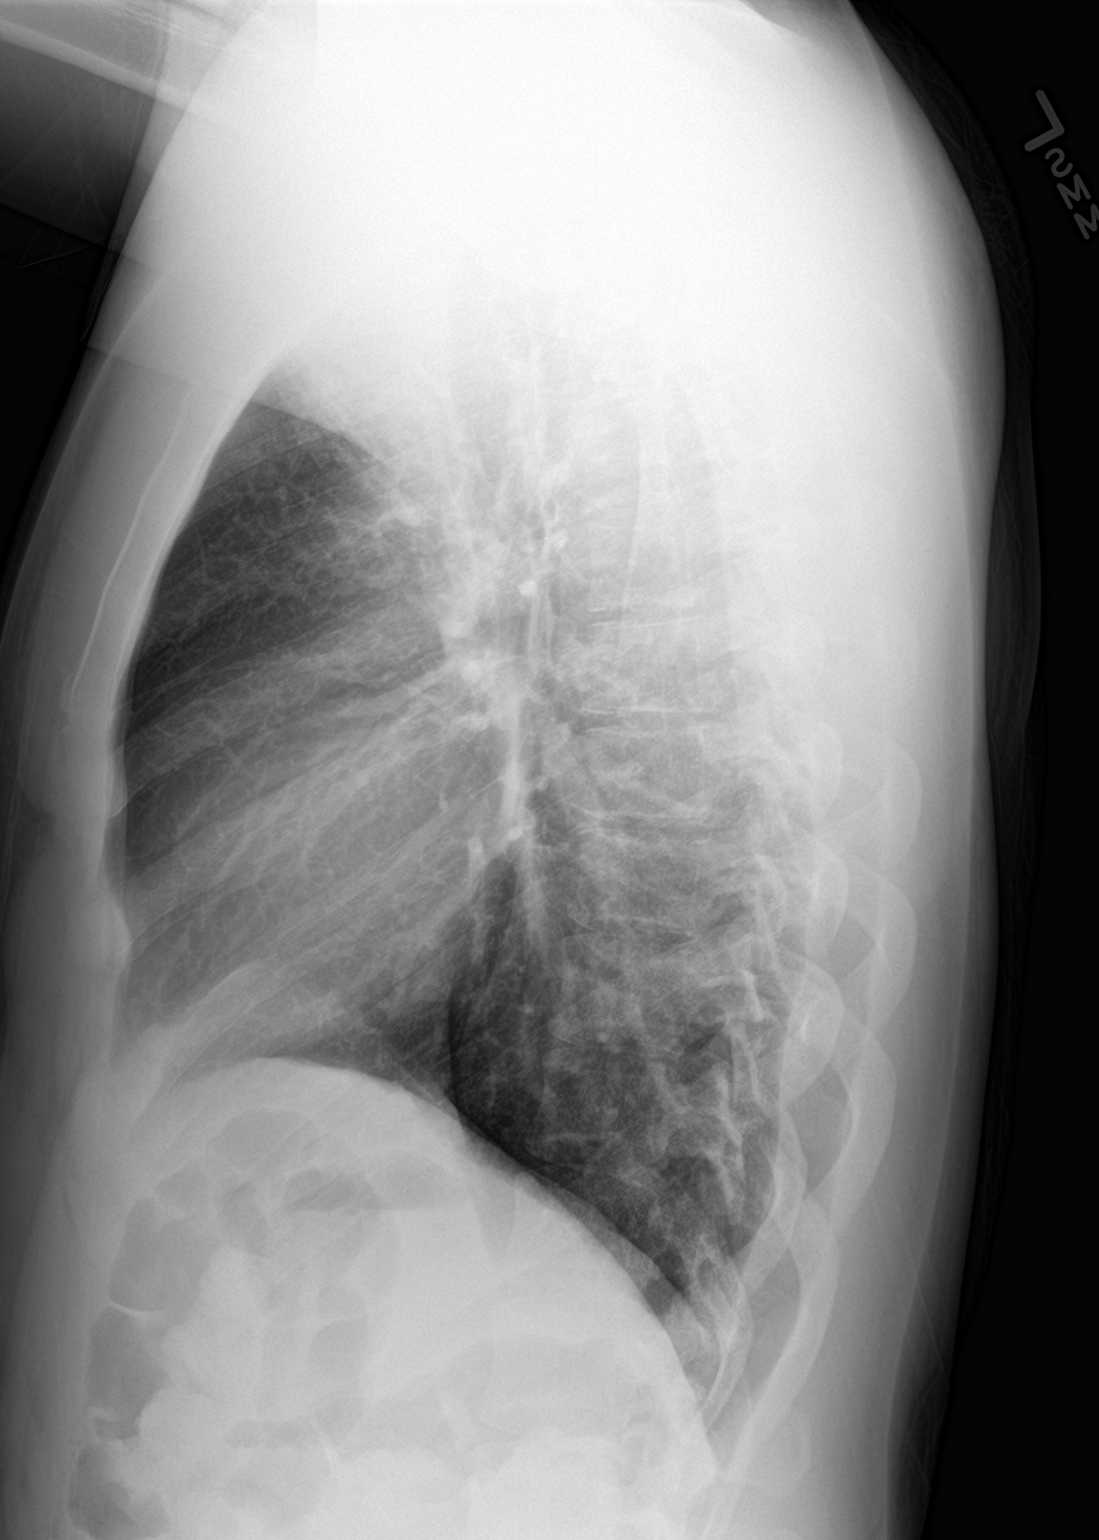

[2 of 2 positions shown; findings below may reference images not displayed]

FINDINGS: Normal sized heart. Clear lungs. Mild peribronchial thickening.
Stable mild to moderate dextroconvex scoliosis.
IMPRESSION: Mild bronchitic changes.

## 2020-09-18 ENCOUNTER — Other Ambulatory Visit: Payer: BC Managed Care – PPO

## 2020-09-18 DIAGNOSIS — Z20822 Contact with and (suspected) exposure to covid-19: Secondary | ICD-10-CM

## 2020-09-19 ENCOUNTER — Telehealth: Payer: Self-pay | Admitting: *Deleted

## 2020-09-19 LAB — SARS-COV-2, NAA 2 DAY TAT

## 2020-09-19 LAB — NOVEL CORONAVIRUS, NAA: SARS-CoV-2, NAA: DETECTED — AB

## 2020-09-19 NOTE — Telephone Encounter (Signed)
Patient notified that COVID test results were still pending at this time. Understanding verbalized. New MyChart link sent via text message.

## 2020-12-07 ENCOUNTER — Encounter: Payer: Self-pay | Admitting: Emergency Medicine

## 2020-12-07 ENCOUNTER — Other Ambulatory Visit: Payer: Self-pay

## 2020-12-07 ENCOUNTER — Ambulatory Visit
Admission: EM | Admit: 2020-12-07 | Discharge: 2020-12-07 | Disposition: A | Discharge status: Home / Self Care | Payer: BC Managed Care – PPO | Attending: Emergency Medicine | Admitting: Emergency Medicine

## 2020-12-07 DIAGNOSIS — R300 Dysuria: Secondary | ICD-10-CM

## 2020-12-07 LAB — POCT URINALYSIS DIP (MANUAL ENTRY)
Bilirubin, UA: NEGATIVE
Blood, UA: NEGATIVE
Glucose, UA: NEGATIVE mg/dL
Ketones, POC UA: NEGATIVE mg/dL
Leukocytes, UA: NEGATIVE
Nitrite, UA: NEGATIVE
Protein Ur, POC: NEGATIVE mg/dL
Spec Grav, UA: 1.02 (ref 1.010–1.025)
Urobilinogen, UA: 0.2 E.U./dL
pH, UA: 7 (ref 5.0–8.0)

## 2020-12-07 MED ORDER — DOXYCYCLINE HYCLATE 100 MG PO CAPS: 100.0000 mg | ORAL_CAPSULE | Freq: Two times a day (BID) | ORAL | 0 refills | Status: AC

## 2020-12-07 MED ORDER — CEFTRIAXONE SODIUM 500 MG IJ SOLR
500.0000 mg | Freq: Once | INTRAMUSCULAR | Status: AC
  Administered 2020-12-07: 500 mg via INTRAMUSCULAR

## 2020-12-07 NOTE — ED Provider Notes (Signed)
[facility]    CSN: [id number] Arrival date & time: 12/07/20  1504      History   Chief Complaint Chief Complaint  Patient presents with  . Dysuria    HPI Carlos Clark. is a 25 y.o. male history of asthma presenting today for evaluation of dysuria.  Patient reports that over the past 2 to 3 days has developed some intermittent dysuria and discomfort with urination.  Denies any testicle pain or swelling.  Denies penile discharge.  Reports that symptoms did began the day following protected oral intercourse, denies any penetration.  He is concerned about saliva getting on the shaft of his penis.  Denies any urinary frequency, hematuria.  Denies any known rashes.  HPI  Past Medical History:  Diagnosis Date  . Allergy   . Asthma   . Erysipelas 08/12/2012   fever/ lymphangitis/wrist lesion/+ Group A strep culture  . Migraine headache     Patient Active Problem List   Diagnosis Date Noted  . Well child check 08/11/2012  . Extrinsic asthma, history of 04/26/2012  . Seasonal allergies 04/26/2012  . Food allergy 04/26/2012    History reviewed. No pertinent surgical history.     Home Medications    Prior to Admission medications   Medication Sig Start Date End Date Taking? Authorizing Provider  doxycycline (VIBRAMYCIN) 100 MG capsule Take 1 capsule (100 mg total) by mouth 2 (two) times daily for 7 days. 12/07/20 12/14/20 Yes Laria Grimmett C, PA-C  albuterol (PROVENTIL HFA;VENTOLIN HFA) 108 (90 Base) MCG/ACT inhaler Inhale 2 puffs into the lungs every 4 (four) hours as needed for wheezing or shortness of breath (cough, shortness of breath or wheezing.). 11/13/18   Bing Neighbors, FNP  albuterol (PROVENTIL) (2.5 MG/3ML) 0.083% nebulizer solution Take 3 mLs (2.5 mg total) by nebulization every 6 (six) hours as needed for wheezing or shortness of breath. 11/16/18   Dahlia Byes A, NP  cetirizine (ZYRTEC ALLERGY) 10 MG tablet Take 1 tablet (10 mg total) by mouth  as needed for allergies. 11/13/18 12/07/20  Bing Neighbors, FNP    Family History Family History  Problem Relation Age of Onset  . Hypertension Mother   . Migraines Mother   . Hypertension Maternal Uncle   . Asthma Paternal Uncle   . Diabetes Maternal Grandmother   . Hypertension Maternal Grandfather   . Diabetes Maternal Grandfather   . Hypertension Paternal Grandmother   . Asthma Paternal Grandfather     Social History Social History   Tobacco Use  . Smoking status: Never Smoker  . Smokeless tobacco: Never Used  Substance Use Topics  . Alcohol use: No  . Drug use: No     Allergies   Peanut-containing drug products and Shellfish allergy   Review of Systems Review of Systems  Constitutional: Negative for fever.  HENT: Negative for sore throat.   Respiratory: Negative for shortness of breath.   Cardiovascular: Negative for chest pain.  Gastrointestinal: Negative for abdominal pain, nausea and vomiting.  Genitourinary: Positive for dysuria. Negative for difficulty urinating, frequency, penile discharge, penile pain, penile swelling, scrotal swelling and testicular pain.  Skin: Negative for rash.  Neurological: Negative for dizziness, light-headedness and headaches.     Physical Exam Triage Vital Signs ED Triage Vitals  Enc Vitals Group     BP 12/07/20 1545 (!) 142/83     Pulse Rate 12/07/20 1545 88     Resp 12/07/20 1545 18     Temp 12/07/20 1545  98.8 F (37.1 C)     Temp Source 12/07/20 1545 Oral     SpO2 12/07/20 1545 98 %     Weight --      Height --      Head Circumference --      Peak Flow --      Pain Score 12/07/20 1541 5     Pain Loc --      Pain Edu? --      Excl. in GC? --    No data found.  Updated Vital Signs BP (!) 142/83 (BP Location: Left Arm)   Pulse 88   Temp 98.8 F (37.1 C) (Oral)   Resp 18   SpO2 98%   Visual Acuity Right Eye Distance:   Left Eye Distance:   Bilateral Distance:    Right Eye Near:   Left Eye Near:     Bilateral Near:     Physical Exam Vitals and nursing note reviewed.  Constitutional:      Appearance: He is well-developed.     Comments: No acute distress  HENT:     Head: Normocephalic and atraumatic.     Nose: Nose normal.  Eyes:     Conjunctiva/sclera: Conjunctivae normal.  Cardiovascular:     Rate and Rhythm: Normal rate.  Pulmonary:     Effort: Pulmonary effort is normal. No respiratory distress.  Abdominal:     General: There is no distension.  Genitourinary:    Comments: Circumcised, no rashes or lesions noted, no erythema or discharge noted in urethral meatus, no testicle swelling noted Musculoskeletal:        General: Normal range of motion.     Cervical back: Neck supple.  Skin:    General: Skin is warm and dry.  Neurological:     Mental Status: He is alert and oriented to person, place, and time.      UC Treatments / Results  Labs (all labs ordered are listed, but only abnormal results are displayed) Labs Reviewed  HIV ANTIBODY (ROUTINE TESTING W REFLEX)  RPR  POCT URINALYSIS DIP (MANUAL ENTRY)  CYTOLOGY, (ORAL, ANAL, URETHRAL) ANCILLARY ONLY    EKG   Radiology No results found.  Procedures Procedures (including critical care time)  Medications Ordered in UC Medications  cefTRIAXone (ROCEPHIN) injection 500 mg (500 mg Intramuscular Given 12/07/20 1649)    Initial Impression / Assessment and Plan / UC Course  I have reviewed the triage vital signs and the nursing notes.  Pertinent labs & imaging results that were available during my care of the patient were reviewed by me and considered in my medical decision making (see chart for details).     Dysuria/STD screening-UA unremarkable for signs of UTI, most suspicious of STD as cause of dysuria.  Empirically treating for gonorrhea and chlamydia with Rocephin and doxycycline.  Urethral swab pending along with blood work for further screening.  Will call with results and alter therapy as  needed.  Discussed strict return precautions. Patient verbalized understanding and is agreeable with plan.  Final Clinical Impressions(s) / UC Diagnoses   Final diagnoses:  Dysuria     Discharge Instructions     We gave you a shot of Rocephin to treat gonorrhea, continue with doxycycline twice daily for 1 week to cover chlamydia.  We are testing you for HIV, Syphillis, Gonorrhea, Chlamydia and Trichomonas. We will call you if anything is positive and let you know if you require any further treatment. Please inform partner of any  positive results.  Please return if symptoms not improving with treatment, development of fever, nausea, vomiting, abdominal pain, scrotal pain.    ED Prescriptions    Medication Sig Dispense Auth. Provider   doxycycline (VIBRAMYCIN) 100 MG capsule Take 1 capsule (100 mg total) by mouth 2 (two) times daily for 7 days. 14 capsule Beverly Suriano, [place] C, PA-C     PDMP not reviewed this encounter.   Lew Dawes, PA-C 12/07/20 1705

## 2020-12-07 NOTE — Discharge Instructions (Addendum)
We gave you a shot of Rocephin to treat gonorrhea, continue with doxycycline twice daily for 1 week to cover chlamydia.  We are testing you for HIV, Syphillis, Gonorrhea, Chlamydia and Trichomonas. We will call you if anything is positive and let you know if you require any further treatment. Please inform partner of any positive results.  Please return if symptoms not improving with treatment, development of fever, nausea, vomiting, abdominal pain, scrotal pain.

## 2020-12-07 NOTE — ED Triage Notes (Signed)
Pt presents today with c/o of painful urination and would like to be tested for STDs and for possible UTI. X 3 days

## 2020-12-08 ENCOUNTER — Ambulatory Visit: Payer: Self-pay

## 2020-12-08 LAB — RPR: RPR Ser Ql: NONREACTIVE

## 2020-12-08 LAB — HIV ANTIBODY (ROUTINE TESTING W REFLEX): HIV Screen 4th Generation wRfx: NONREACTIVE

## 2020-12-10 LAB — CYTOLOGY, (ORAL, ANAL, URETHRAL) ANCILLARY ONLY
Chlamydia: NEGATIVE
Comment: NEGATIVE
Comment: NEGATIVE
Comment: NORMAL
Neisseria Gonorrhea: NEGATIVE
Trichomonas: NEGATIVE

## 2020-12-12 ENCOUNTER — Emergency Department
Admission: EM | Admit: 2020-12-12 | Discharge: 2020-12-12 | Disposition: A | Discharge status: Home / Self Care | Payer: BC Managed Care – PPO | Source: Home / Self Care

## 2020-12-12 ENCOUNTER — Other Ambulatory Visit: Payer: Self-pay

## 2020-12-12 DIAGNOSIS — R300 Dysuria: Secondary | ICD-10-CM | POA: Diagnosis not present

## 2020-12-12 DIAGNOSIS — R868 Other abnormal findings in specimens from male genital organs: Secondary | ICD-10-CM

## 2020-12-12 DIAGNOSIS — N4889 Other specified disorders of penis: Secondary | ICD-10-CM

## 2020-12-12 MED ORDER — METRONIDAZOLE 500 MG PO TABS: 500.0000 mg | ORAL_TABLET | Freq: Two times a day (BID) | ORAL | 0 refills | Status: DC

## 2020-12-12 NOTE — ED Triage Notes (Signed)
Pt c/o painful urination since last week. Was seen at [facility] last Friday. Tx for gonorrhea and chlamydia but tested negative. Also states he has an intermittent throbbing/burning sensation at the tip of his penis.

## 2020-12-12 NOTE — Discharge Instructions (Signed)
I have sent in flagyl for you to take twice a day for 7 days.  Your testing was negative, but with your symptoms, we will cover with the antibiotic anyway.  Follow up with this office or with primary care if symptoms are persisting.  Follow up in the ER for high fever, trouble swallowing, trouble breathing, other concerning symptoms.

## 2020-12-13 NOTE — ED Provider Notes (Signed)
[facility]    CSN: [id number] Arrival date & time: 12/12/20  1850      History   Chief Complaint Chief Complaint  Patient presents with  . STD Testing    HPI Maitland Branson Kranz. is a 25 y.o. male.   Reports dysuria for the last 2 weeks, reports discolored semen as well. Reports that there is a yellow color to his semen that he has seen over the last week. Reports small papules to shaft of the penis, just under the glans. Denies penile discharge, testicular pain/swelling, pain with ejaculation, other symptoms. Was seen at urgent care and was treated with doxycycline and Rocephin. States that he has 2 doses of doxycycline left. Reports no change in symptoms since treatment. Reports possible STD exposure previous to treatment. Cytology swab testing was negative at previous visit. Patient is concerned that he did not swab himself correctly or that it was too early. Expresses concern for trichomonas.  ROS per HPI  The history is provided by the patient.    Past Medical History:  Diagnosis Date  . Allergy   . Asthma   . Erysipelas 08/12/2012   fever/ lymphangitis/wrist lesion/+ Group A strep culture  . Migraine headache     Patient Active Problem List   Diagnosis Date Noted  . Well child check 08/11/2012  . Extrinsic asthma, history of 04/26/2012  . Seasonal allergies 04/26/2012  . Food allergy 04/26/2012    History reviewed. No pertinent surgical history.     Home Medications    Prior to Admission medications   Medication Sig Start Date End Date Taking? Authorizing Provider  metroNIDAZOLE (FLAGYL) 500 MG tablet Take 1 tablet (500 mg total) by mouth 2 (two) times daily. 12/12/20  Yes Moshe Cipro, NP  albuterol (PROVENTIL HFA;VENTOLIN HFA) 108 (90 Base) MCG/ACT inhaler Inhale 2 puffs into the lungs every 4 (four) hours as needed for wheezing or shortness of breath (cough, shortness of breath or wheezing.). 11/13/18   Bing Neighbors, FNP  albuterol  (PROVENTIL) (2.5 MG/3ML) 0.083% nebulizer solution Take 3 mLs (2.5 mg total) by nebulization every 6 (six) hours as needed for wheezing or shortness of breath. 11/16/18   Dahlia Byes A, NP  doxycycline (VIBRAMYCIN) 100 MG capsule Take 1 capsule (100 mg total) by mouth 2 (two) times daily for 7 days. 12/07/20 12/14/20  Wieters, Hallie C, PA-C  cetirizine (ZYRTEC ALLERGY) 10 MG tablet Take 1 tablet (10 mg total) by mouth as needed for allergies. 11/13/18 12/07/20  Bing Neighbors, FNP    Family History Family History  Problem Relation Age of Onset  . Hypertension Mother   . Migraines Mother   . Hypertension Maternal Uncle   . Asthma Paternal Uncle   . Diabetes Maternal Grandmother   . Hypertension Maternal Grandfather   . Diabetes Maternal Grandfather   . Hypertension Paternal Grandmother   . Asthma Paternal Grandfather     Social History Social History   Tobacco Use  . Smoking status: Never Smoker  . Smokeless tobacco: Never Used  Substance Use Topics  . Alcohol use: No  . Drug use: No     Allergies   Peanut-containing drug products and Shellfish allergy   Review of Systems Review of Systems   Physical Exam Triage Vital Signs ED Triage Vitals  Enc Vitals Group     BP 12/12/20 1909 (!) 142/84     Pulse Rate 12/12/20 1909 82     Resp 12/12/20 1909 18  Temp 12/12/20 1909 98.8 F (37.1 C)     Temp Source 12/12/20 1909 Oral     SpO2 12/12/20 1909 95 %     Weight --      Height --      Head Circumference --      Peak Flow --      Pain Score 12/12/20 1910 0     Pain Loc --      Pain Edu? --      Excl. in GC? --    No data found.  Updated Vital Signs BP (!) 142/84 (BP Location: Right Arm)   Pulse 82   Temp 98.8 F (37.1 C) (Oral)   Resp 18   SpO2 95%      Physical Exam Vitals and nursing note reviewed. Exam conducted with a chaperone present (Rachelle Idol, CMA).  Constitutional:      General: He is not in acute distress.    Appearance: Normal  appearance. He is well-developed. He is not ill-appearing.  HENT:     Head: Normocephalic and atraumatic.     Mouth/Throat:     Mouth: Mucous membranes are moist.     Pharynx: Oropharynx is clear.  Eyes:     Extraocular Movements: Extraocular movements intact.     Conjunctiva/sclera: Conjunctivae normal.     Pupils: Pupils are equal, round, and reactive to light.  Cardiovascular:     Rate and Rhythm: Normal rate and regular rhythm.     Heart sounds: No murmur heard.   Pulmonary:     Effort: Pulmonary effort is normal. No respiratory distress.     Breath sounds: Normal breath sounds.  Abdominal:     Palpations: Abdomen is soft.     Tenderness: There is no abdominal tenderness.  Genitourinary:    Penis: Circumcised.      Comments: Cluster of 8-10 small papules to the underside of the shaft of the penis at the glans, no erythema, drainage, non tender Musculoskeletal:        General: Normal range of motion.     Cervical back: Normal range of motion and neck supple.  Skin:    General: Skin is warm and dry.     Capillary Refill: Capillary refill takes less than 2 seconds.  Neurological:     General: No focal deficit present.     Mental Status: He is alert and oriented to person, place, and time.  Psychiatric:        Mood and Affect: Mood normal.        Behavior: Behavior normal.        Thought Content: Thought content normal.      UC Treatments / Results  Labs (all labs ordered are listed, but only abnormal results are displayed) Labs Reviewed - No data to display  EKG   Radiology No results found.  Procedures Procedures (including critical care time)  Medications Ordered in UC Medications - No data to display  Initial Impression / Assessment and Plan / UC Course  I have reviewed the triage vital signs and the nursing notes.  Pertinent labs & imaging results that were available during my care of the patient were reviewed by me and considered in my medical  decision making (see chart for details).    Dysuria Discolored Semen Pearly Penile Papules  Discussed that it could have been too early for a positive swab or that it may have been collected incorrectly Will go ahead and treat with Metronidazole 500mg  BID x  7 days UA negative at last visit and patient declines today Has RPR/HIV that was negative at last visit as well Follow up with this office or with primary care if symptoms are persisting.  Follow up in the ER for high fever, trouble swallowing, trouble breathing, other concerning symptoms.     Final Clinical Impressions(s) / UC Diagnoses   Final diagnoses:  Dysuria  Discolored semen  Pearly penile papules     Discharge Instructions     I have sent in flagyl for you to take twice a day for 7 days.  Your testing was negative, but with your symptoms, we will cover with the antibiotic anyway.  Follow up with this office or with primary care if symptoms are persisting.  Follow up in the ER for high fever, trouble swallowing, trouble breathing, other concerning symptoms.     ED Prescriptions    Medication Sig Dispense Auth. Provider   metroNIDAZOLE (FLAGYL) 500 MG tablet Take 1 tablet (500 mg total) by mouth 2 (two) times daily. 14 tablet Moshe Cipro, NP     PDMP not reviewed this encounter.   Moshe Cipro, NP 12/13/20 1145

## 2020-12-15 ENCOUNTER — Ambulatory Visit
Admission: RE | Admit: 2020-12-15 | Discharge: 2020-12-15 | Disposition: A | Discharge status: Home / Self Care | Payer: BC Managed Care – PPO | Source: Ambulatory Visit | Attending: Internal Medicine | Admitting: Internal Medicine

## 2020-12-15 ENCOUNTER — Other Ambulatory Visit: Payer: Self-pay

## 2020-12-15 VITALS — BP 132/82 | HR 89 | Temp 99.0°F | Resp 16

## 2020-12-15 DIAGNOSIS — N50819 Testicular pain, unspecified: Secondary | ICD-10-CM

## 2020-12-15 NOTE — ED Triage Notes (Signed)
Pt presents with testicle pain and small red spot on tip of penis. States is currently being treated for STD.

## 2020-12-15 NOTE — Discharge Instructions (Signed)
All your testing has been negative for STI You have treated antibiotics for common STIs so you do not have to worry about having an STI.

## 2020-12-15 NOTE — ED Provider Notes (Addendum)
[facility]    CSN: [id number] Arrival date & time: 12/15/20  1302      History   Chief Complaint Chief Complaint  Patient presents with  . Testicle Pain    HPI Carlos Zackariah Clark. is a 25 y.o. male comes to the urgent care complaining of testicular pain and red spots on the tip of the penis.  Patient was recently been seen twice in the urgent care for similar complaints.  He has tested negative for STI and has received ceftriaxone/doxycycline/metronidazole over the past couple of weeks.  Patient was engaged in casual sexual intercourse on 3/15.   Patient was here mainly for reassurance that his physical exam is normal  HPI  Past Medical History:  Diagnosis Date  . Allergy   . Asthma   . Erysipelas 08/12/2012   fever/ lymphangitis/wrist lesion/+ Group A strep culture  . Migraine headache     Patient Active Problem List   Diagnosis Date Noted  . Well child check 08/11/2012  . Extrinsic asthma, history of 04/26/2012  . Seasonal allergies 04/26/2012  . Food allergy 04/26/2012    History reviewed. No pertinent surgical history.     Home Medications    Prior to Admission medications   Medication Sig Start Date End Date Taking? Authorizing Provider  albuterol (PROVENTIL HFA;VENTOLIN HFA) 108 (90 Base) MCG/ACT inhaler Inhale 2 puffs into the lungs every 4 (four) hours as needed for wheezing or shortness of breath (cough, shortness of breath or wheezing.). 11/13/18   Bing Neighbors, FNP  albuterol (PROVENTIL) (2.5 MG/3ML) 0.083% nebulizer solution Take 3 mLs (2.5 mg total) by nebulization every 6 (six) hours as needed for wheezing or shortness of breath. 11/16/18   Bast, Traci A, NP  metroNIDAZOLE (FLAGYL) 500 MG tablet Take 1 tablet (500 mg total) by mouth 2 (two) times daily. 12/12/20   Moshe Cipro, NP  cetirizine (ZYRTEC ALLERGY) 10 MG tablet Take 1 tablet (10 mg total) by mouth as needed for allergies. 11/13/18 12/07/20  Bing Neighbors, FNP     Family History Family History  Problem Relation Age of Onset  . Hypertension Mother   . Migraines Mother   . Hypertension Maternal Uncle   . Asthma Paternal Uncle   . Diabetes Maternal Grandmother   . Hypertension Maternal Grandfather   . Diabetes Maternal Grandfather   . Hypertension Paternal Grandmother   . Asthma Paternal Grandfather     Social History Social History   Tobacco Use  . Smoking status: Never Smoker  . Smokeless tobacco: Never Used  Substance Use Topics  . Alcohol use: No  . Drug use: No     Allergies   Peanut-containing drug products and Shellfish allergy   Review of Systems Review of Systems  Genitourinary: Positive for testicular pain. Negative for penile pain, penile swelling and scrotal swelling.     Physical Exam Triage Vital Signs ED Triage Vitals  Enc Vitals Group     BP 12/15/20 1309 132/82     Pulse Rate 12/15/20 1309 89     Resp 12/15/20 1309 16     Temp 12/15/20 1309 99 F (37.2 C)     Temp Source 12/15/20 1309 Oral     SpO2 12/15/20 1309 98 %     Weight --      Height --      Head Circumference --      Peak Flow --      Pain Score 12/15/20 1308 4  Pain Loc --      Pain Edu? --      Excl. in GC? --    No data found.  Updated Vital Signs BP 132/82 (BP Location: Left Arm)   Pulse 89   Temp 99 F (37.2 C) (Oral)   Resp 16   SpO2 98%   Visual Acuity Right Eye Distance:   Left Eye Distance:   Bilateral Distance:    Right Eye Near:   Left Eye Near:    Bilateral Near:     Physical Exam Vitals and nursing note reviewed.  Pulmonary:     Effort: Pulmonary effort is normal.     Breath sounds: Normal breath sounds.  Abdominal:     General: Bowel sounds are normal.     Palpations: Abdomen is soft.     Comments: No groin tenderness or swelling.  Genitourinary:    Penis: Normal.      Testes: Normal.      UC Treatments / Results  Labs (all labs ordered are listed, but only abnormal results are  displayed) Labs Reviewed - No data to display  EKG   Radiology No results found.  Procedures Procedures (including critical care time)  Medications Ordered in UC Medications - No data to display  Initial Impression / Assessment and Plan / UC Course  I have reviewed the triage vital signs and the nursing notes.  Pertinent labs & imaging results that were available during my care of the patient were reviewed by me and considered in my medical decision making (see chart for details).     1.  Testicular discomfort: Patient was reassured that he is tested negative for urinary tract infection His penile exam was unremarkable No new recommendations Safe sex practices advised Final Clinical Impressions(s) / UC Diagnoses   Final diagnoses:  Testicular discomfort     Discharge Instructions     All your testing has been negative for STI You have treated antibiotics for common STIs so you do not have to worry about having an STI.   ED Prescriptions    None     PDMP not reviewed this encounter.   Merrilee Jansky, MD 12/15/20 1407    Merrilee Jansky, MD 12/15/20 [phone]

## 2020-12-29 ENCOUNTER — Other Ambulatory Visit: Payer: Self-pay

## 2020-12-29 ENCOUNTER — Other Ambulatory Visit (HOSPITAL_COMMUNITY)
Admission: RE | Admit: 2020-12-29 | Discharge: 2020-12-29 | Disposition: A | Discharge status: Home / Self Care | Payer: BC Managed Care – PPO | Source: Ambulatory Visit | Attending: Family Medicine | Admitting: Family Medicine

## 2020-12-29 ENCOUNTER — Emergency Department
Admission: RE | Admit: 2020-12-29 | Discharge: 2020-12-29 | Disposition: A | Discharge status: Home / Self Care | Payer: BC Managed Care – PPO | Source: Ambulatory Visit | Attending: Family Medicine | Admitting: Family Medicine

## 2020-12-29 VITALS — BP 132/89 | HR 92 | Temp 99.1°F | Resp 20 | Ht 73.0 in | Wt 209.0 lb

## 2020-12-29 DIAGNOSIS — R300 Dysuria: Secondary | ICD-10-CM | POA: Diagnosis not present

## 2020-12-29 NOTE — ED Provider Notes (Addendum)
[name]    CSN: [id number] Arrival date & time: 12/29/20  0944      History   Chief Complaint Chief Complaint  Patient presents with  . Dysuria  . Testicle Pain    HPI Carlos Clark. is a 25 y.o. male.   HPI   Patient is here for dysuria.  States he is also having intermittent aching in his left testicle.  He is not having any testicular pain today.  He states that there is no mass or swelling in his testicle.  He states that dysuria has persisted ever since early March.  He had an unprotected sexual encounter at that time.  He has had testing and treatment for his dysuria, this is his fourth medical visit.  He has had 2 courses of doxycycline.  Treatment with azithromycin.  A shot of Rocephin.  A course of metronidazole.  In addition to the visits documented in the medical record he has had a televisit.  It has been recommended to him to go to urology in follow-up.  He is requesting referral to urology. He has no rash.  He has no lesions.  He has no fever or chills.  He has no other illness. He has a lot of questions about what this could be.  He has been looking on the Internet and speaking to other medical providers.  He worries whether he could have a resistant bacteria or an unusual bacteria.  He worries that it could be early symptoms from a virus such as HIV, HPV, HSV.  I explained to him that the HIV testing does need to be repeated in another month.  There is no testing for HPV or HSV in a male without lesions.   Past Medical History:  Diagnosis Date  . Allergy   . Asthma   . Erysipelas 08/12/2012   fever/ lymphangitis/wrist lesion/+ Group A strep culture  . Migraine headache     Patient Active Problem List   Diagnosis Date Noted  . Well child check 08/11/2012  . Extrinsic asthma, history of 04/26/2012  . Seasonal allergies 04/26/2012  . Food allergy 04/26/2012    History reviewed. No pertinent surgical history.     Home Medications     Prior to Admission medications   Medication Sig Start Date End Date Taking? Authorizing Provider  albuterol (PROVENTIL HFA;VENTOLIN HFA) 108 (90 Base) MCG/ACT inhaler Inhale 2 puffs into the lungs every 4 (four) hours as needed for wheezing or shortness of breath (cough, shortness of breath or wheezing.). 11/13/18   Bing Neighbors, FNP  albuterol (PROVENTIL) (2.5 MG/3ML) 0.083% nebulizer solution Take 3 mLs (2.5 mg total) by nebulization every 6 (six) hours as needed for wheezing or shortness of breath. 11/16/18   Dahlia Byes A, NP  cetirizine (ZYRTEC ALLERGY) 10 MG tablet Take 1 tablet (10 mg total) by mouth as needed for allergies. 11/13/18 12/07/20  Bing Neighbors, FNP    Family History Family History  Problem Relation Age of Onset  . Hypertension Mother   . Migraines Mother   . Hypertension Maternal Uncle   . Asthma Paternal Uncle   . Diabetes Maternal Grandmother   . Hypertension Maternal Grandfather   . Diabetes Maternal Grandfather   . Hypertension Paternal Grandmother   . Asthma Paternal Grandfather   . Healthy Father     Social History Social History   Tobacco Use  . Smoking status: Never Smoker  . Smokeless tobacco: Never Used  Vaping Use  .  Vaping Use: Never used  Substance Use Topics  . Alcohol use: No  . Drug use: No     Allergies   Peanut-containing drug products and Shellfish allergy   Review of Systems Review of Systems See HPI  Physical Exam Triage Vital Signs ED Triage Vitals  Enc Vitals Group     BP 12/29/20 1006 132/89     Pulse Rate 12/29/20 1006 92     Resp 12/29/20 1006 20     Temp 12/29/20 1006 99.1 F (37.3 C)     Temp Source 12/29/20 1006 Oral     SpO2 12/29/20 1006 99 %     Weight 12/29/20 1008 209 lb (94.8 kg)     Height 12/29/20 1008 6\' 1"  (1.854 m)     Head Circumference --      Peak Flow --      Pain Score 12/29/20 1007 5     Pain Loc --      Pain Edu? --      Excl. in GC? --    No data found.  Updated Vital  Signs BP 132/89 (BP Location: Right Arm)   Pulse 92   Temp 99.1 F (37.3 C) (Oral)   Resp 20   Ht 6\' 1"  (1.854 m)   Wt 94.8 kg   SpO2 99%   BMI 27.57 kg/m      Physical Exam Constitutional:      General: He is not in acute distress.    Appearance: He is well-developed.  HENT:     Head: Normocephalic and atraumatic.     Mouth/Throat:     Comments: Mask is in place Eyes:     Conjunctiva/sclera: Conjunctivae normal.     Pupils: Pupils are equal, round, and reactive to light.  Cardiovascular:     Rate and Rhythm: Normal rate.  Pulmonary:     Effort: Pulmonary effort is normal. No respiratory distress.  Abdominal:     General: There is no distension.     Palpations: Abdomen is soft.  Genitourinary:    Comments: Penis is normal.  Circumcised.  Pearly papules noted are still present.  No discharge.  No rash or lesion.  No adenopathy.  No mass or tenderness of testicles. Musculoskeletal:        General: Normal range of motion.     Cervical back: Normal range of motion.  Skin:    General: Skin is warm and dry.  Neurological:     Mental Status: He is alert.      UC Treatments / Results  Labs (all labs ordered are listed, but only abnormal results are displayed) Labs Reviewed  URINE CULTURE  CYTOLOGY, (ORAL, ANAL, URETHRAL) ANCILLARY ONLY    EKG   Radiology No results found.  Procedures Procedures (including critical care time)  Medications Ordered in UC Medications - No data to display  Initial Impression / Assessment and Plan / UC Course  I have reviewed the triage vital signs and the nursing notes.  Pertinent labs & imaging results that were available during my care of the patient were reviewed by me and considered in my medical decision making (see chart for details).     *Urology referral is placed.  Will treat any infections identified on testing Patient had seen multiple providers and spoken with a televisit as well as doing a lot of Internet  research.  He had multiple questions.  More than 30 minutes was spent in answering patient's questions Final Clinical Impressions(s) /  UC Diagnoses   Final diagnoses:  Dysuria     Discharge Instructions     Drink lots of water The urine/swab results will be available in a couple of days You will be called if any tests are positive  Check My Chart for results   ED Prescriptions    None     PDMP not reviewed this encounter.   Eustace Moore, MD 12/29/20 1157    Eustace Moore, MD 12/29/20 1159

## 2020-12-29 NOTE — Discharge Instructions (Addendum)
Drink lots of water The urine/swab results will be available in a couple of days You will be called if any tests are positive  Check My Chart for results

## 2020-12-29 NOTE — ED Triage Notes (Signed)
Pt presents to Urgent Care with c/o continued s/s of STI after being treated w/ various medications recently. States he has dysuria and generalized discomfort in urethra and also pain in L testicle.

## 2020-12-30 LAB — URINE CULTURE
MICRO NUMBER:: 11752739
Result:: NO GROWTH
SPECIMEN QUALITY:: ADEQUATE

## 2021-01-01 LAB — CYTOLOGY, (ORAL, ANAL, URETHRAL) ANCILLARY ONLY
Chlamydia: NEGATIVE
Comment: NEGATIVE
Comment: NEGATIVE
Comment: NORMAL
Neisseria Gonorrhea: NEGATIVE
Trichomonas: NEGATIVE

## 2021-01-17 ENCOUNTER — Encounter: Payer: Self-pay | Admitting: Urology

## 2021-01-17 ENCOUNTER — Ambulatory Visit: Payer: BC Managed Care – PPO | Admitting: Urology

## 2021-01-17 ENCOUNTER — Other Ambulatory Visit: Payer: Self-pay

## 2021-01-17 VITALS — BP 143/92 | HR 73 | Ht 74.0 in | Wt 208.0 lb

## 2021-01-17 DIAGNOSIS — R102 Pelvic and perineal pain: Secondary | ICD-10-CM | POA: Diagnosis not present

## 2021-01-17 DIAGNOSIS — R300 Dysuria: Secondary | ICD-10-CM

## 2021-01-17 MED ORDER — CELECOXIB 200 MG PO CAPS
200.0000 mg | ORAL_CAPSULE | Freq: Every day | ORAL | 0 refills | Status: DC
Start: 2021-01-17 — End: 2021-11-25

## 2021-01-17 NOTE — Patient Instructions (Signed)
Pelvic Floor Dysfunction  Pelvic floor dysfunction (PFD) is a condition that results when the group of muscles and connective tissues that support the organs in the pelvis (pelvic floor muscles) do not work well. These muscles and their connections form a sling that supports the colon and bladder. In men, these muscles also support the prostate gland. In women, they also support the uterus. PFD causes pelvic floor muscles to be too weak, too tight, or a combination of both. In PFD, muscle movements are not coordinated. This condition may cause bowel or bladder problems. It may also cause pain. What are the causes? This condition may be caused by an injury to the pelvic area or by a weakening of pelvic muscles. This often results from pregnancy and childbirth or other types of strain. In many cases, the exact cause is not known. What increases the risk? The following factors may make you more likely to develop this condition:  Having a condition of chronic bladder tissue inflammation (interstitial cystitis).  Being an older person.  Being overweight.  Radiation treatment for cancer in the pelvic region.  Previous pelvic surgery, such as removal of the uterus (hysterectomy) or prostate gland (prostatectomy). What are the signs or symptoms? Symptoms of this condition vary and may include:  Bladder symptoms, such as: ? Trouble starting urination and emptying the bladder. ? Frequent urinary tract infections. ? Leaking urine when coughing, laughing, or exercising (stress incontinence). ? Having to pass urine urgently or frequently. ? Pain when passing urine.  Bowel symptoms, such as: ? Constipation. ? Urgent or frequent bowel movements. ? Incomplete bowel movements. ? Painful bowel movements. ? Leaking stool or gas.  Unexplained genital or rectal pain.  Genital or rectal muscle spasms.  Low back pain. In women, symptoms of PFD may also include:  A heavy, full, or aching feeling in  the vagina.  A bulge that protrudes into the vagina.  Pain during or after sexual intercourse. How is this diagnosed? This condition may be diagnosed based on:  Your symptoms and medical history.  A physical exam. During the exam, your health care provider may check your pelvic muscles for tightness, spasm, pain, or weakness. This may include a rectal exam and a pelvic exam for women. In some cases, you may have diagnostic tests, such as:  Electrical muscle function tests.  Urine flow testing.  X-ray tests of bowel function.  Ultrasound of the pelvic organs. How is this treated? Treatment for this condition depends on your symptoms. Treatment options include:  Physical therapy. This may include Kegel exercises to help relax or strengthen the pelvic floor muscles.  Biofeedback. This type of therapy provides feedback on how tight your pelvic floor muscles are so that you can learn to control them.  Internal or external massage therapy.  A treatment that involves electrical stimulation of the pelvic floor muscles to help control pain (transcutaneous electrical nerve stimulation, or TENS).  Sound wave therapy (ultrasound) to reduce muscle spasms.  Medicines, such as: ? Muscle relaxants. ? Bladder control medicines. Surgery to reconstruct or support pelvic floor muscles may be an option if other treatments do not help. Follow these instructions at home: Activity  Do your usual activities as told by your health care provider. Ask your health care provider if you should modify any activities.  Do pelvic floor strengthening or relaxing exercises at home as told by your physical therapist. Lifestyle  Maintain a healthy weight.  Eat foods that are high in fiber, such as   beans, whole grains, and fresh fruits and vegetables.  Limit foods that are high in fat and processed sugars, such as fried or sweet foods.  Manage stress with relaxation techniques such as yoga or  meditation. General instructions  If you have problems with leakage: ? Use absorbable pads or wear padded underwear. ? Wash frequently with mild soap. ? Keep your genital and anal area as clean and dry as possible. ? Ask your health care provider if you should try a barrier cream to prevent skin irritation.  Take warm baths to relieve pelvic muscle tension or spasms.  Take over-the-counter and prescription medicines only as told by your health care provider.  Keep all follow-up visits as told by your health care provider. This is important. Contact a health care provider if you:  Are not improving with home care.  Have signs or symptoms of PFD that get worse at home.  Develop new signs or symptoms at home.  Have signs of a urinary tract infection, such as: ? Fever. ? Chills. ? Urinary frequency. ? A burning feeling when urinating.  Have not had a bowel movement in 3 days (constipation). Summary  Pelvic floor dysfunction results when the muscles and connective tissues in your pelvic floor do not work well.  These muscles and their connections form a sling that supports your colon and bladder. In men, these muscles also support the prostate gland. In women, they also support the uterus.  PFD may be caused by an injury to the pelvic area or by a weakening of pelvic muscles.  PFD causes pelvic floor muscles to be too weak, too tight, or a combination of both. Symptoms may vary from person to person.  In most cases, PFD can be treated with physical therapies and medicines. Surgery may be an option if other treatments do not help. This information is not intended to replace advice given to you by your health care provider. Make sure you discuss any questions you have with your health care provider. Document Revised: 03/29/2018 Document Reviewed: 03/29/2018 Elsevier Patient Education  2021 [employer] Inc[employer]  

## 2021-01-17 NOTE — Progress Notes (Signed)
   01/17/21 9:25 AM   Carlos Clark. 03/15/1996 [id number]  CC: Dysuria, left testicular pain  HPI: I saw [name] for the above issues today.  He works as a Naval [employer].  He has had multiple ER visits over the last 6 weeks after developing dysuria and left testicular pain after a sexual encounter.  He has had extensive STD testing that has all been negative.  He has been treated with multiple antibiotics including ceftriaxone, azithromycin, doxycycline, metronidazole.  He has not had any imaging performed.  Urinalysis has been benign, and urine culture was negative.  He also reports some pain with ejaculation, and urinary frequency, as well as some low back pain and pelvic pain.  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  He denies any similar symptoms.  No history of gross hematuria or urologic surgeries.   PMH: Past Medical History:  Diagnosis Date  . Allergy   . Asthma   . Erysipelas 08/12/2012   fever/ lymphangitis/wrist lesion/+ Group A strep culture  . Migraine headache     Family History: Family History  Problem Relation Age of Onset  . Hypertension Mother   . Migraines Mother   . Hypertension Maternal Uncle   . Asthma Paternal Uncle   . Diabetes Maternal Grandmother   . Hypertension Maternal Grandfather   . Diabetes Maternal Grandfather   . Hypertension Paternal Grandmother   . Asthma Paternal Grandfather   . Healthy Father     Social History:  reports that he has never smoked. He has never used smokeless tobacco. He reports that he does not drink alcohol and does not use drugs.  Physical Exam: BP (!) 143/92   Pulse 73   Ht 6\' 2"  (1.88 m)   Wt 208 lb (94.3 kg)   BMI 26.71 kg/m    Constitutional:  Alert and oriented, No acute distress. Cardiovascular: No clubbing, cyanosis, or edema. Respiratory: Normal respiratory effort, no increased work of breathing. GI: Abdomen is soft, nontender,  nondistended, no abdominal masses GU: Circumcised phallus with patent meatus, no lesions, testicles 20 cc and descended bilaterally without masses, no significant tenderness on exam  Laboratory Data: Reviewed, see HPI  Pertinent Imaging: None to review  Assessment & Plan:   25 year old male with 6 weeks of pelvic discomfort, dysuria, and left testicular pain with extensive infectious work-up for UTI and STD that has all been negative.  He has been on multiple courses of antibiotics.  His physical exam is completely benign.  He feels like his symptoms have improved somewhat over the last few weeks.  His history and exam are most consistent with pelvic floor dysfunction.  We had a long conversation about pelvic floor dysfunction today and strategies including pelvic floor physical therapy, exercise, seat cushion, adequate hydration, and stress reduction.  I recommended starting with a trial of Celebrex for 3 weeks, and we discussed possible need for pelvic floor physical therapy in the future if he does not have any improvement.  Behavioral strategies discussed extensively.  Celebrex daily x3 weeks Follow-up if no improvement in symptoms, and consider referral to pelvic floor physical therapy at that time Consider cystoscopy in the future if refractory symptoms or microscopic hematuria  25, MD 01/17/2021  [facility] [address] [place], [place] [id number]

## 2021-01-21 NOTE — Telephone Encounter (Signed)
Sondra Come, MD  You 27 minutes ago (9:27 AM)     Can offer him a cysto with me next week to rule out urethral stricture. He can cancel if resolved by then.

## 2021-01-21 NOTE — Telephone Encounter (Signed)
Spoke with patient and advised results appt scheduled 01/24/2021

## 2021-01-24 ENCOUNTER — Ambulatory Visit (INDEPENDENT_AMBULATORY_CARE_PROVIDER_SITE_OTHER): Payer: BC Managed Care – PPO | Admitting: Urology

## 2021-01-24 ENCOUNTER — Encounter: Payer: Self-pay | Admitting: Urology

## 2021-01-24 ENCOUNTER — Other Ambulatory Visit: Payer: Self-pay

## 2021-01-24 VITALS — BP 135/84 | HR 98 | Ht 74.0 in | Wt 208.0 lb

## 2021-01-24 DIAGNOSIS — M6289 Other specified disorders of muscle: Secondary | ICD-10-CM

## 2021-01-24 DIAGNOSIS — R300 Dysuria: Secondary | ICD-10-CM

## 2021-01-24 MED ORDER — LIDOCAINE HCL URETHRAL/MUCOSAL 2 % EX GEL
1.0000 "application " | Freq: Once | CUTANEOUS | Status: AC
  Administered 2021-01-24: 1 via URETHRAL

## 2021-01-24 NOTE — Progress Notes (Signed)
Cystoscopy Procedure Note:  Indication: Dysuria  After informed consent and discussion of the procedure and its risks, Carlos Clark. was positioned and prepped in the standard fashion. Cystoscopy was performed with a flexible cystoscope.  Urethra was normal throughout with no abnormalities or strictures.  The urethra, bladder neck and entire bladder was visualized in a standard fashion. The prostate was short and nonobstructive. The ureteral orifices were visualized in their normal location and orientation.  No abnormalities on retroflexion  Findings: Normal cystoscopy  Assessment and Plan: Suspect pelvic floor dysfunction as etiology of his urinary symptoms, behavioral strategies discussed at length, continue Celebrex, referral placed to pelvic floor physical therapy  Legrand Rams, MD 01/24/2021

## 2021-03-14 ENCOUNTER — Ambulatory Visit: Payer: Self-pay | Admitting: Urology

## 2021-07-12 ENCOUNTER — Other Ambulatory Visit: Payer: Self-pay

## 2021-07-12 ENCOUNTER — Emergency Department (INDEPENDENT_AMBULATORY_CARE_PROVIDER_SITE_OTHER)
Admission: RE | Admit: 2021-07-12 | Discharge: 2021-07-12 | Disposition: A | Discharge status: Home / Self Care | Payer: BC Managed Care – PPO | Source: Ambulatory Visit

## 2021-07-12 VITALS — BP 151/91 | HR 83 | Temp 98.6°F | Resp 17

## 2021-07-12 DIAGNOSIS — R059 Cough, unspecified: Secondary | ICD-10-CM

## 2021-07-12 DIAGNOSIS — J309 Allergic rhinitis, unspecified: Secondary | ICD-10-CM | POA: Diagnosis not present

## 2021-07-12 DIAGNOSIS — J0100 Acute maxillary sinusitis, unspecified: Secondary | ICD-10-CM

## 2021-07-12 MED ORDER — PREDNISONE 20 MG PO TABS: ORAL_TABLET | ORAL | 0 refills | Status: DC

## 2021-07-12 MED ORDER — FEXOFENADINE HCL 180 MG PO TABS: 180.0000 mg | ORAL_TABLET | Freq: Every day | ORAL | 0 refills | Status: DC

## 2021-07-12 MED ORDER — CEFDINIR 300 MG PO CAPS: 300.0000 mg | ORAL_CAPSULE | Freq: Two times a day (BID) | ORAL | 0 refills | Status: AC

## 2021-07-12 NOTE — ED Provider Notes (Signed)
[facility]    CSN: [id number] Arrival date & time: 07/12/21  1653      History   Chief Complaint Chief Complaint  Patient presents with   Cough    dry    HPI Carlos Clark. is a 25 y.o. male.   HPI 25 year old male presents with flu symptoms and cough for 1 week.  Patient reports flu symptoms has resolved but all that remains is dry cough.  PMH significant for asthma.  Past Medical History:  Diagnosis Date   Allergy    Asthma    Erysipelas 08/12/2012   fever/ lymphangitis/wrist lesion/+ Group A strep culture   Migraine headache     Patient Active Problem List   Diagnosis Date Noted   Well child check 08/11/2012   Extrinsic asthma, history of 04/26/2012   Seasonal allergies 04/26/2012   Food allergy 04/26/2012    History reviewed. No pertinent surgical history.     Home Medications    Prior to Admission medications   Medication Sig Start Date End Date Taking? Authorizing Provider  cefdinir (OMNICEF) 300 MG capsule Take 1 capsule (300 mg total) by mouth 2 (two) times daily for 7 days. 07/12/21 07/19/21 Yes Trevor Iha, FNP  fexofenadine [facility] ALLERGY) 180 MG tablet Take 1 tablet (180 mg total) by mouth daily for 15 days. 07/12/21 07/27/21 Yes Trevor Iha, FNP  predniSONE (DELTASONE) 20 MG tablet Take 3 tabs PO x 5 days. 07/12/21  Yes Trevor Iha, FNP  celecoxib (CELEBREX) 200 MG capsule Take 1 capsule (200 mg total) by mouth daily. 01/17/21   Sondra Come, MD  cetirizine (ZYRTEC) 10 MG tablet Take 10 mg by mouth as needed for allergies. 01/17/21   [provider]    Family History Family History  Problem Relation Age of Onset   Hypertension Mother    Migraines Mother    Hypertension Maternal Uncle    Asthma Paternal Uncle    Diabetes Maternal Grandmother    Hypertension Maternal Grandfather    Diabetes Maternal Grandfather    Hypertension Paternal Grandmother    Asthma Paternal Grandfather    Healthy Father      Social History Social History   Tobacco Use   Smoking status: Never   Smokeless tobacco: Never  Vaping Use   Vaping Use: Never used  Substance Use Topics   Alcohol use: No   Drug use: No     Allergies   Peanut-containing drug products, Shellfish allergy, Fish-derived products, and Other   Review of Systems Review of Systems  Respiratory:  Positive for cough.   All other systems reviewed and are negative.   Physical Exam Triage Vital Signs ED Triage Vitals  Enc Vitals Group     BP 07/12/21 1719 (!) 151/91     Pulse Rate 07/12/21 1719 83     Resp 07/12/21 1719 17     Temp 07/12/21 1719 98.6 F (37 C)     Temp Source 07/12/21 1719 Oral     SpO2 07/12/21 1719 97 %     Weight --      Height --      Head Circumference --      Peak Flow --      Pain Score 07/12/21 1720 0     Pain Loc --      Pain Edu? --      Excl. in GC? --    No data found.  Updated Vital Signs BP (!) 151/91 (BP Location: Left  Arm)   Pulse 83   Temp 98.6 F (37 C) (Oral)   Resp 17   SpO2 97%       Physical Exam Vitals and nursing note reviewed.  Constitutional:      General: He is not in acute distress.    Appearance: Normal appearance. He is obese. He is not ill-appearing.  HENT:     Head: Normocephalic and atraumatic.     Right Ear: Tympanic membrane, ear canal and external ear normal.     Left Ear: Tympanic membrane, ear canal and external ear normal.     Mouth/Throat:     Mouth: Mucous membranes are moist.     Pharynx: Oropharynx is clear.  Eyes:     Extraocular Movements: Extraocular movements intact.     Conjunctiva/sclera: Conjunctivae normal.     Pupils: Pupils are equal, round, and reactive to light.  Cardiovascular:     Rate and Rhythm: Normal rate and regular rhythm.     Pulses: Normal pulses.     Heart sounds: Normal heart sounds.  Pulmonary:     Effort: Pulmonary effort is normal.     Breath sounds: Normal breath sounds.  Musculoskeletal:        General:  Normal range of motion.     Cervical back: Normal range of motion.  Skin:    General: Skin is warm and dry.  Neurological:     General: No focal deficit present.     Mental Status: He is alert and oriented to person, place, and time.     UC Treatments / Results  Labs (all labs ordered are listed, but only abnormal results are displayed) Labs Reviewed - No data to display  EKG   Radiology No results found.  Procedures Procedures (including critical care time)  Medications Ordered in UC Medications - No data to display  Initial Impression / Assessment and Plan / UC Course  I have reviewed the triage vital signs and the nursing notes.  Pertinent labs & imaging results that were available during my care of the patient were reviewed by me and considered in my medical decision making (see chart for details).     MDM: 1.  Subacute maxillary sinusitis-Rx'd cefdinir; 2.  Cough-Rx'd prednisone burst; 3.  Allergic rhinitis-Rx'd Allegra. Advised patient to take medication as directed with food to completion.  Advised/instructed patient to discontinue current daily Zyrtec. Advised patient to take prednisone burst and Allegra with first dose of antibiotic for 5 of 7 days.  Advised patient may use Allegra afterwards for concurrent postnasal drainage/drip. Encouraged patient increase daily water intake while taking these medications.  Patient discharged home, hemodynamically stable Final Clinical Impressions(s) / UC Diagnoses   Final diagnoses:  Subacute maxillary sinusitis  Cough, unspecified type  Allergic rhinitis, unspecified seasonality, unspecified trigger     Discharge Instructions      Advised patient to take medication as directed with food to completion.  Advised/instructed patient to discontinue current daily Zyrtec. Advised patient to take prednisone burst and Allegra with first dose of antibiotic for 5 of 7 days.  Advised patient may use Allegra afterwards for concurrent  postnasal drainage/drip.  Encouraged patient increase daily water intake while taking these medications.     ED Prescriptions     Medication Sig Dispense Auth. Provider   cefdinir (OMNICEF) 300 MG capsule Take 1 capsule (300 mg total) by mouth 2 (two) times daily for 7 days. 14 capsule Trevor Iha, FNP   predniSONE (DELTASONE) 20 MG  tablet Take 3 tabs PO x 5 days. 15 tablet Trevor Iha, FNP   fexofenadine [facility] ALLERGY) 180 MG tablet Take 1 tablet (180 mg total) by mouth daily for 15 days. 15 tablet Trevor Iha, FNP      PDMP not reviewed this encounter.   Trevor Iha, FNP 07/12/21 Carlos Clark

## 2021-07-12 NOTE — Discharge Instructions (Addendum)
Advised patient to take medication as directed with food to completion.  Advised/instructed patient to discontinue current daily Zyrtec. Advised patient to take prednisone burst and Allegra with first dose of antibiotic for 5 of 7 days.  Advised patient may use Allegra afterwards for concurrent postnasal drainage/drip.  Encouraged patient increase daily water intake while taking these medications.

## 2021-07-12 NOTE — ED Triage Notes (Signed)
Pt c/o flu sxs the last week. Everything resolved except dry cough. Dayquil and nyquil prn.

## 2021-11-25 ENCOUNTER — Emergency Department
Admission: RE | Admit: 2021-11-25 | Discharge: 2021-11-25 | Discharge status: Absent without leave | Payer: Self-pay | Source: Ambulatory Visit

## 2021-11-25 ENCOUNTER — Other Ambulatory Visit: Payer: Self-pay

## 2021-11-25 ENCOUNTER — Encounter: Payer: Self-pay | Admitting: Family Medicine

## 2021-11-25 ENCOUNTER — Emergency Department
Admission: EM | Admit: 2021-11-25 | Discharge: 2021-11-25 | Disposition: A | Discharge status: Home / Self Care | Payer: BC Managed Care – PPO | Source: Home / Self Care

## 2021-11-25 ENCOUNTER — Other Ambulatory Visit (HOSPITAL_COMMUNITY)
Admission: RE | Admit: 2021-11-25 | Discharge: 2021-11-25 | Disposition: A | Discharge status: Home / Self Care | Payer: BC Managed Care – PPO | Source: Ambulatory Visit | Attending: Family Medicine | Admitting: Family Medicine

## 2021-11-25 DIAGNOSIS — Z7289 Other problems related to lifestyle: Secondary | ICD-10-CM | POA: Diagnosis not present

## 2021-11-25 DIAGNOSIS — Z113 Encounter for screening for infections with a predominantly sexual mode of transmission: Secondary | ICD-10-CM | POA: Diagnosis not present

## 2021-11-25 DIAGNOSIS — Z202 Contact with and (suspected) exposure to infections with a predominantly sexual mode of transmission: Secondary | ICD-10-CM | POA: Diagnosis not present

## 2021-11-25 DIAGNOSIS — B354 Tinea corporis: Secondary | ICD-10-CM | POA: Diagnosis not present

## 2021-11-25 HISTORY — DX: Other specified disorders of muscle: M62.89

## 2021-11-25 MED ORDER — KETOCONAZOLE 2 % EX CREA: TOPICAL_CREAM | CUTANEOUS | 0 refills | Status: AC

## 2021-11-25 NOTE — ED Provider Notes (Signed)
?[facility] ? ? ? ?CSN: [id number] ?Arrival date & time: 11/25/21  1901 ? ? ?  ? ?History   ?Chief Complaint ?Chief Complaint  ?Patient presents with  ? Rash  ? ? ?HPI ?Carlos Clark. is a 26 y.o. male.  ? ?HPI 26 year old male presents with a rash of lower abdomen and upper pelvic area for 1 month.  Additionally patient request STD testing this evening. ? ?Past Medical History:  ?Diagnosis Date  ? Allergy   ? Asthma   ? Erysipelas 08/12/2012  ? fever/ lymphangitis/wrist lesion/+ Group A strep culture  ? Migraine headache   ? Pelvic floor dysfunction   ? ? ?Patient Active Problem List  ? Diagnosis Date Noted  ? Well child check 08/11/2012  ? Extrinsic asthma, history of 04/26/2012  ? Seasonal allergies 04/26/2012  ? Food allergy 04/26/2012  ? ? ?History reviewed. No pertinent surgical history. ? ? ? ? ?Home Medications   ? ?Prior to Admission medications   ?Medication Sig Start Date End Date Taking? Authorizing Provider  ?ketoconazole (NIZORAL) 2 % cream Apply topically to affected area of central lower abdomen twice daily x 2 weeks 11/25/21  Yes Trevor Iha, FNP  ?OVER THE COUNTER MEDICATION OTC cream for ringworm   Yes [provider]  ?cetirizine (ZYRTEC) 10 MG tablet Take 10 mg by mouth as needed for allergies. 01/17/21   [provider]  ? ? ?Family History ?Family History  ?Problem Relation Age of Onset  ? Hypertension Mother   ? Migraines Mother   ? Hypertension Maternal Uncle   ? Asthma Paternal Uncle   ? Diabetes Maternal Grandmother   ? Hypertension Maternal Grandfather   ? Diabetes Maternal Grandfather   ? Hypertension Paternal Grandmother   ? Asthma Paternal Grandfather   ? Healthy Father   ? ? ?Social History ?Social History  ? ?Tobacco Use  ? Smoking status: Never  ? Smokeless tobacco: Never  ?Vaping Use  ? Vaping Use: Never used  ?Substance Use Topics  ? Alcohol use: Yes  ?  Alcohol/week: 3.0 standard drinks  ?  Types: 3 Standard drinks or equivalent per week  ?   Comment: weekly  ? Drug use: No  ? ? ? ?Allergies   ?Peanut-containing drug products, Shellfish allergy, Fish-derived products, and Other ? ? ?Review of Systems ?Review of Systems  ?Genitourinary:   ?     Request for STD testing.  ?Skin:  Positive for rash.  ? ? ?Physical Exam ?Triage Vital Signs ?ED Triage Vitals  ?Enc Vitals Group  ?   BP 11/25/21 1919 (!) 137/97  ?   Pulse --   ?   Resp --   ?   Temp --   ?   Temp src --   ?   SpO2 --   ?   Weight 11/25/21 1918 216 lb (98 kg)  ?   Height 11/25/21 1918 6\' 2"  (1.88 m)  ?   Head Circumference --   ?   Peak Flow --   ?   Pain Score 11/25/21 1918 0  ?   Pain Loc --   ?   Pain Edu? --   ?   Excl. in GC? --   ? ?No data found. ? ?Updated Vital Signs ?BP (!) 155/87   Pulse (!) 106   Temp 98.8 ?F (37.1 ?C) (Oral)   Resp 20   Ht 6\' 2"  (1.88 m)   Wt 216 lb (98 kg)  SpO2 98%   BMI 27.73 kg/m?  ? ?  ? ?Physical Exam ?Vitals and nursing note reviewed.  ?Constitutional:   ?   General: He is not in acute distress. ?   Appearance: Normal appearance. He is normal weight. He is not ill-appearing.  ?HENT:  ?   Head: Normocephalic and atraumatic.  ?   Mouth/Throat:  ?   Mouth: Mucous membranes are moist.  ?   Pharynx: Oropharynx is clear.  ?Eyes:  ?   Extraocular Movements: Extraocular movements intact.  ?   Conjunctiva/sclera: Conjunctivae normal.  ?   Pupils: Pupils are equal, round, and reactive to light.  ?Cardiovascular:  ?   Rate and Rhythm: Normal rate and regular rhythm.  ?   Pulses: Normal pulses.  ?   Heart sounds: Normal heart sounds.  ?Pulmonary:  ?   Effort: Pulmonary effort is normal.  ?   Breath sounds: Normal breath sounds.  ?Musculoskeletal:  ?   Cervical back: Normal range of motion and neck supple.  ?Skin: ?   General: Skin is warm and dry.  ?   Comments: Lower abdomen/superior suprapubic area (central aspect): Pruritic slightly raised round to oval plaques, with sharp scaling border noted  ?Neurological:  ?   General: No focal deficit present.  ?   Mental  Status: He is alert and oriented to person, place, and time.  ? ? ? ?UC Treatments / Results  ?Labs ?(all labs ordered are listed, but only abnormal results are displayed) ?Labs Reviewed  ?CERVICOVAGINAL ANCILLARY ONLY  ? ? ?EKG ? ? ?Radiology ?No results found. ? ?Procedures ?Procedures (including critical care time) ? ?Medications Ordered in UC ?Medications - No data to display ? ?Initial Impression / Assessment and Plan / UC Course  ?I have reviewed the triage vital signs and the nursing notes. ? ?Pertinent labs & imaging results that were available during my care of the patient were reviewed by me and considered in my medical decision making (see chart for details). ? ?  ? ?MDM: 1.  Potential exposure to STD-Aptima swab ordered; 2.  Tinea of the body-Rx'd Ketoconazole. Advised patient to use topical cream as directed.  Advised patient we will follow-up with lab results once received.  Advised patient if rash worsens and/or unresolved please follow-up with PCP for possible dermatology referral or here for further evaluation.  Patient discharged home, hemodynamically stable. ?Final Clinical Impressions(s) / UC Diagnoses  ? ?Final diagnoses:  ?Potential exposure to STD  ?Tinea of the body  ? ? ? ?Discharge Instructions   ? ?  ?Advised patient to use topical cream as directed.  Advised patient we will follow-up with lab results once received.  Advised patient if rash worsens and/or unresolved please follow-up with PCP for possible dermatology referral or here for further evaluation. ? ? ? ? ?ED Prescriptions   ? ? Medication Sig Dispense Auth. Provider  ? ketoconazole (NIZORAL) 2 % cream Apply topically to affected area of central lower abdomen twice daily x 2 weeks 30 g Trevor Iha, FNP  ? ?  ? ?PDMP not reviewed this encounter. ?  ?Trevor Iha, FNP ?11/25/21 1954 ? ?

## 2021-11-25 NOTE — Discharge Instructions (Addendum)
Advised patient to use topical cream as directed.  Advised patient we will follow-up with lab results once received.  Advised patient if rash worsens and/or unresolved please follow-up with PCP for possible dermatology referral or here for further evaluation. ?

## 2021-11-25 NOTE — ED Triage Notes (Signed)
Pt presents to Urgent Care with c/o rash to lower abdomen/upper pelvic area x approx 1 month. Requesting STD testing.  ?

## 2021-11-27 LAB — CERVICOVAGINAL ANCILLARY ONLY
Chlamydia: NEGATIVE
Comment: NEGATIVE
Comment: NEGATIVE
Comment: NORMAL
Neisseria Gonorrhea: NEGATIVE
Trichomonas: NEGATIVE

## 2021-12-15 ENCOUNTER — Telehealth: Payer: BC Managed Care – PPO | Admitting: Nurse Practitioner

## 2021-12-15 DIAGNOSIS — R21 Rash and other nonspecific skin eruption: Secondary | ICD-10-CM

## 2021-12-15 NOTE — Progress Notes (Signed)
?Virtual Visit Consent  ? ?Carlos Clark., you are scheduled for a virtual visit with a [facility] provider today.   ?  ?Just as with appointments in the office, your consent must be obtained to participate.  Your consent will be active for this visit and any virtual visit you may have with one of our providers in the next 365 days.   ?  ?If you have a MyChart account, a copy of this consent can be sent to you electronically.  All virtual visits are billed to your insurance company just like a traditional visit in the office.   ? ?As this is a virtual visit, video technology does not allow for your provider to perform a traditional examination.  This may limit your provider's ability to fully assess your condition.  If your provider identifies any concerns that need to be evaluated in person or the need to arrange testing (such as labs, EKG, etc.), we will make arrangements to do so.   ?  ?Although advances in technology are sophisticated, we cannot ensure that it will always work on either your end or our end.  If the connection with a video visit is poor, the visit may have to be switched to a telephone visit.  With either a video or telephone visit, we are not always able to ensure that we have a secure connection.    ? ?I need to obtain your verbal consent now.   Are you willing to proceed with your visit today?  ?  ?Carlos Clark. has provided verbal consent on 12/15/2021 for a virtual visit (video or telephone). ?  ?Abran Cantor, NP  ? ?Date: 12/15/2021 4:17 PM ? ? ?Virtual Visit via Video Note  ? ?Carlos Clark, connected with  Ireland Chagnon  ([id number], [birth year]) on 12/15/21 at  4:30 PM EDT by a video-enabled telemedicine application and verified that I am speaking with the correct person using two identifiers. ? ?Location: ?Patient: Virtual Visit Location Patient: Home ?Provider: Virtual Visit Location Provider: Home ?  ?I discussed the limitations of evaluation and  management by telemedicine and the availability of in person appointments. The patient expressed understanding and agreed to proceed.   ? ?History of Present Illness: ?Carlos Clark. is a 26 y.o. who identifies as a male who was assigned male at birth, and is being seen today for a rash on his penis. He states the he noticed a "shiny" area and a red bump. Denies pain, fever, penile discharge or dysuria. He was seen approximately 2 -3 weeks ago and treated for ringworm. He is concerned he has an STI or that the ringworm has spread. Reports he does have a history of eczema. ? ?HPI: HPI  ?Problems:  ?Patient Active Problem List  ? Diagnosis Date Noted  ? Well child check 08/11/2012  ? Extrinsic asthma, history of 04/26/2012  ? Seasonal allergies 04/26/2012  ? Food allergy 04/26/2012  ?  ?Allergies:  ?Allergies  ?Allergen Reactions  ? Peanut-Containing Drug Products Swelling  ? Shellfish Allergy Swelling, Rash and Nausea And Vomiting  ?  Pt is allergic all seafood  ? Fish-Derived Products   ?  Other reaction(s): Unknown  ? Other Rash  ?  Other reaction(s): Unknown ?Fresh Seafood. Nuts. Mold. Pollen. Dogs. Cats.  ?  ? ?Medications:  ?Current Outpatient Medications:  ?  cetirizine (ZYRTEC) 10 MG tablet, Take 10 mg by mouth as needed for allergies., Disp: 90 tablet, Rfl: 0 ?  ketoconazole (NIZORAL) 2 % cream, Apply topically to affected area of central lower abdomen twice daily x 2 weeks, Disp: 30 g, Rfl: 0 ?  OVER THE COUNTER MEDICATION, OTC cream for ringworm, Disp: , Rfl:  ? ?Observations/Objective: ?Patient is well-developed, well-nourished in no acute distress.  ?Resting comfortably at home.  ?Head is normocephalic, atraumatic.  ?No labored breathing.  ?Speech is clear and coherent with logical content.  ?Patient is alert and oriented at baseline.  ? ? ?Assessment and Plan: ?1. Rash and nonspecific skin eruption ? ?Patient presents with complaints of a "shiny" area and red bump on his penis that he noticed today.  Unable to accurately see any symptoms at this time due to limitations with video and the patient's skin complexion made it difficult to visualize his symptoms. Patient was advised to continue monitoring symptoms since he noticed them today for any change, could not rule out eczema flare, If symptoms worsen, recommended a face to face evaluation.  ? ?Follow Up Instructions: ?I discussed the assessment and treatment plan with the patient. The patient was provided an opportunity to ask questions and all were answered. The patient agreed with the plan and demonstrated an understanding of the instructions.  A copy of instructions were sent to the patient via MyChart unless otherwise noted below.  ? ? ? ?The patient was advised to call back or seek an in-person evaluation if the symptoms worsen or if the condition fails to improve as anticipated. ? ?Time:  ?I spent 10 minutes with the patient via telehealth technology discussing the above problems/concerns.   ? ?Abran Cantor, NP ?

## 2021-12-15 NOTE — Patient Instructions (Signed)
?  Carlos Dorothy Clark., thank you for joining Abran Cantor, NP for today's virtual visit.  While this provider is not your primary care provider (PCP), if your PCP is located in our provider database this encounter information will be shared with them immediately following your visit. ? ?Consent: ?(Patient) Carlos Clark. provided verbal consent for this virtual visit at the beginning of the encounter. ? ?Current Medications: ? ?Current Outpatient Medications:  ?  cetirizine (ZYRTEC) 10 MG tablet, Take 10 mg by mouth as needed for allergies., Disp: 90 tablet, Rfl: 0 ?  ketoconazole (NIZORAL) 2 % cream, Apply topically to affected area of central lower abdomen twice daily x 2 weeks, Disp: 30 g, Rfl: 0 ?  OVER THE COUNTER MEDICATION, OTC cream for ringworm, Disp: , Rfl:   ? ?Medications ordered in this encounter:  ?No orders of the defined types were placed in this encounter. ?  ? ?*If you need refills on other medications prior to your next appointment, please contact your pharmacy* ? ?Follow-Up: ?Call back or seek an in-person evaluation if the symptoms worsen or if the condition fails to improve as anticipated. ? ?Other Instructions ?Continue monitoring symptoms for any change or worsening. If so, recommend a face to face evaluation.  ? ? ?If you have been instructed to have an in-person evaluation today at a local Urgent Care facility, please use the link below. It will take you to a list of all of our available [facility], including address, phone number and hours of operation. Please do not delay care.  ?[facility] ? ?If you or a family member do not have a primary care provider, use the link below to schedule a visit and establish care. When you choose a [facility] primary care physician or advanced practice provider, you gain a long-term partner in health. ?Find a Primary Care Provider ? ?Learn more about [facility]'s in-office and virtual care options: ? -  Get Care Now ? ?

## 2022-01-02 DIAGNOSIS — Z23 Encounter for immunization: Secondary | ICD-10-CM | POA: Diagnosis not present

## 2022-01-02 DIAGNOSIS — N50812 Left testicular pain: Secondary | ICD-10-CM | POA: Diagnosis not present

## 2022-01-02 DIAGNOSIS — Z113 Encounter for screening for infections with a predominantly sexual mode of transmission: Secondary | ICD-10-CM | POA: Diagnosis not present

## 2022-01-02 DIAGNOSIS — I10 Essential (primary) hypertension: Secondary | ICD-10-CM | POA: Diagnosis not present

## 2022-01-02 DIAGNOSIS — M6289 Other specified disorders of muscle: Secondary | ICD-10-CM | POA: Diagnosis not present

## 2022-01-02 DIAGNOSIS — B356 Tinea cruris: Secondary | ICD-10-CM | POA: Diagnosis not present

## 2022-01-02 DIAGNOSIS — J4520 Mild intermittent asthma, uncomplicated: Secondary | ICD-10-CM | POA: Diagnosis not present
